# Patient Record
Sex: Male | Born: 1980 | Race: Black or African American | Hispanic: No | Marital: Married | Smoking: Current every day smoker
Health system: Southern US, Community
[De-identification: ages and names within clinical notes are randomized; demographics above are authoritative.]

## PROBLEM LIST (undated history)

## (undated) DIAGNOSIS — M545 Low back pain, unspecified: Secondary | ICD-10-CM

## (undated) DIAGNOSIS — R51 Headache: Secondary | ICD-10-CM

## (undated) DIAGNOSIS — S069X9A Unspecified intracranial injury with loss of consciousness of unspecified duration, initial encounter: Secondary | ICD-10-CM

## (undated) DIAGNOSIS — I456 Pre-excitation syndrome: Secondary | ICD-10-CM

## (undated) DIAGNOSIS — J45909 Unspecified asthma, uncomplicated: Secondary | ICD-10-CM

## (undated) DIAGNOSIS — R569 Unspecified convulsions: Secondary | ICD-10-CM

## (undated) DIAGNOSIS — G8929 Other chronic pain: Secondary | ICD-10-CM

## (undated) DIAGNOSIS — R519 Headache, unspecified: Secondary | ICD-10-CM

## (undated) HISTORY — PX: NO PAST SURGERIES: SHX2092

---
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
## 2015-10-10 ENCOUNTER — Emergency Department (HOSPITAL_COMMUNITY): Payer: Medicaid Other

## 2015-10-10 ENCOUNTER — Inpatient Hospital Stay (HOSPITAL_COMMUNITY): Payer: Medicaid Other

## 2015-10-10 ENCOUNTER — Inpatient Hospital Stay (HOSPITAL_COMMUNITY)
Admission: EM | Admit: 2015-10-10 | Discharge: 2015-10-11 | DRG: 101 | Disposition: A | Discharge status: Home / Self Care | Payer: Medicaid Other | Attending: Cardiovascular Disease | Admitting: Cardiovascular Disease

## 2015-10-10 ENCOUNTER — Encounter (HOSPITAL_COMMUNITY): Payer: Self-pay | Admitting: Emergency Medicine

## 2015-10-10 DIAGNOSIS — F17210 Nicotine dependence, cigarettes, uncomplicated: Secondary | ICD-10-CM | POA: Diagnosis present

## 2015-10-10 DIAGNOSIS — F1290 Cannabis use, unspecified, uncomplicated: Secondary | ICD-10-CM | POA: Diagnosis present

## 2015-10-10 DIAGNOSIS — R9401 Abnormal electroencephalogram [EEG]: Secondary | ICD-10-CM | POA: Diagnosis present

## 2015-10-10 DIAGNOSIS — G40909 Epilepsy, unspecified, not intractable, without status epilepticus: Secondary | ICD-10-CM | POA: Diagnosis not present

## 2015-10-10 DIAGNOSIS — R079 Chest pain, unspecified: Secondary | ICD-10-CM

## 2015-10-10 DIAGNOSIS — R55 Syncope and collapse: Secondary | ICD-10-CM

## 2015-10-10 DIAGNOSIS — I456 Pre-excitation syndrome: Secondary | ICD-10-CM

## 2015-10-10 DIAGNOSIS — R9431 Abnormal electrocardiogram [ECG] [EKG]: Secondary | ICD-10-CM | POA: Diagnosis not present

## 2015-10-10 DIAGNOSIS — Z87820 Personal history of traumatic brain injury: Secondary | ICD-10-CM | POA: Diagnosis not present

## 2015-10-10 DIAGNOSIS — Z8669 Personal history of other diseases of the nervous system and sense organs: Secondary | ICD-10-CM

## 2015-10-10 DIAGNOSIS — R569 Unspecified convulsions: Principal | ICD-10-CM

## 2015-10-10 HISTORY — DX: Unspecified intracranial injury with loss of consciousness of unspecified duration, initial encounter: S06.9X9A

## 2015-10-10 LAB — RAPID URINE DRUG SCREEN, HOSP PERFORMED
AMPHETAMINES: NOT DETECTED
BENZODIAZEPINES: NOT DETECTED
Barbiturates: NOT DETECTED
COCAINE: NOT DETECTED
OPIATES: NOT DETECTED
Tetrahydrocannabinol: POSITIVE — AB

## 2015-10-10 LAB — CBC WITH DIFFERENTIAL/PLATELET
BASOS ABS: 0 10*3/uL (ref 0.0–0.1)
BASOS PCT: 1 %
EOS PCT: 2 %
Eosinophils Absolute: 0.2 10*3/uL (ref 0.0–0.7)
HCT: 37 % — ABNORMAL LOW (ref 39.0–52.0)
Hemoglobin: 12.4 g/dL — ABNORMAL LOW (ref 13.0–17.0)
LYMPHS PCT: 24 %
Lymphs Abs: 2 10*3/uL (ref 0.7–4.0)
MCH: 32 pg (ref 26.0–34.0)
MCHC: 33.5 g/dL (ref 30.0–36.0)
MCV: 95.4 fL (ref 78.0–100.0)
Monocytes Absolute: 0.8 10*3/uL (ref 0.1–1.0)
Monocytes Relative: 10 %
Neutro Abs: 5.3 10*3/uL (ref 1.7–7.7)
Neutrophils Relative %: 63 %
PLATELETS: 235 10*3/uL (ref 150–400)
RBC: 3.88 MIL/uL — AB (ref 4.22–5.81)
RDW: 14.1 % (ref 11.5–15.5)
WBC: 8.4 10*3/uL (ref 4.0–10.5)

## 2015-10-10 LAB — I-STAT CHEM 8, ED
BUN: 11 mg/dL (ref 6–20)
Calcium, Ion: 1.06 mmol/L — ABNORMAL LOW (ref 1.12–1.23)
Chloride: 105 mmol/L (ref 101–111)
Creatinine, Ser: 1.1 mg/dL (ref 0.61–1.24)
GLUCOSE: 91 mg/dL (ref 65–99)
HEMATOCRIT: 40 % (ref 39.0–52.0)
HEMOGLOBIN: 13.6 g/dL (ref 13.0–17.0)
POTASSIUM: 4.2 mmol/L (ref 3.5–5.1)
Sodium: 139 mmol/L (ref 135–145)
TCO2: 25 mmol/L (ref 0–100)

## 2015-10-10 LAB — I-STAT TROPONIN, ED: TROPONIN I, POC: 0 ng/mL (ref 0.00–0.08)

## 2015-10-10 LAB — CBG MONITORING, ED: GLUCOSE-CAPILLARY: 118 mg/dL — AB (ref 65–99)

## 2015-10-10 LAB — COMPREHENSIVE METABOLIC PANEL
ALBUMIN: 3.8 g/dL (ref 3.5–5.0)
ALT: 34 U/L (ref 17–63)
ANION GAP: 5 (ref 5–15)
AST: 29 U/L (ref 15–41)
Alkaline Phosphatase: 36 U/L — ABNORMAL LOW (ref 38–126)
BUN: 10 mg/dL (ref 6–20)
CALCIUM: 8.8 mg/dL — AB (ref 8.9–10.3)
CHLORIDE: 106 mmol/L (ref 101–111)
CO2: 28 mmol/L (ref 22–32)
Creatinine, Ser: 0.92 mg/dL (ref 0.61–1.24)
GFR calc non Af Amer: >60 mL/min (ref >60)
GLUCOSE: 104 mg/dL — AB (ref 65–99)
POTASSIUM: 4.7 mmol/L (ref 3.5–5.1)
SODIUM: 139 mmol/L (ref 135–145)
Total Bilirubin: 0.6 mg/dL (ref 0.3–1.2)
Total Protein: 6.2 g/dL — ABNORMAL LOW (ref 6.5–8.1)

## 2015-10-10 LAB — MRSA PCR SCREENING: MRSA by PCR: NEGATIVE

## 2015-10-10 LAB — APTT: APTT: 153 s — AB (ref 24–37)

## 2015-10-10 LAB — PROTIME-INR
INR: 1.19 (ref 0.00–1.49)
Prothrombin Time: 15.3 seconds — ABNORMAL HIGH (ref 11.6–15.2)

## 2015-10-10 MED ORDER — METOPROLOL TARTRATE 1 MG/ML IV SOLN
INTRAVENOUS | Status: AC
  Filled 2015-10-10: qty 5

## 2015-10-10 MED ORDER — ACETAMINOPHEN 325 MG PO TABS
650.0000 mg | ORAL_TABLET | Freq: Four times a day (QID) | ORAL | Status: DC | PRN
  Administered 2015-10-10 – 2015-10-11 (×3): 650 mg via ORAL
  Filled 2015-10-10 (×2): qty 2

## 2015-10-10 MED ORDER — SODIUM CHLORIDE 0.9 % IV BOLUS (SEPSIS)
500.0000 mL | Freq: Once | INTRAVENOUS | Status: AC
  Administered 2015-10-10: 500 mL via INTRAVENOUS

## 2015-10-10 MED ORDER — MORPHINE SULFATE (PF) 4 MG/ML IV SOLN
4.0000 mg | Freq: Once | INTRAVENOUS | Status: AC
  Administered 2015-10-10: 4 mg via INTRAVENOUS
  Filled 2015-10-10: qty 1

## 2015-10-10 MED ORDER — SODIUM CHLORIDE 0.9 % IV SOLN
500.0000 mg | Freq: Two times a day (BID) | INTRAVENOUS | Status: DC
  Administered 2015-10-11: 500 mg via INTRAVENOUS
  Filled 2015-10-10 (×2): qty 5

## 2015-10-10 MED ORDER — SODIUM CHLORIDE 0.9 % IV SOLN: INTRAVENOUS | Status: DC

## 2015-10-10 MED ORDER — ASPIRIN 81 MG PO CHEW
324.0000 mg | CHEWABLE_TABLET | Freq: Once | ORAL | Status: AC
  Administered 2015-10-10: 324 mg via ORAL

## 2015-10-10 MED ORDER — NITROGLYCERIN 0.4 MG SL SUBL
SUBLINGUAL_TABLET | SUBLINGUAL | Status: AC
  Filled 2015-10-10: qty 1

## 2015-10-10 MED ORDER — INFLUENZA VAC SPLIT QUAD 0.5 ML IM SUSY: 0.5000 mL | PREFILLED_SYRINGE | INTRAMUSCULAR | Status: DC

## 2015-10-10 MED ORDER — HEPARIN SODIUM (PORCINE) 5000 UNIT/ML IJ SOLN
4000.0000 [IU] | INTRAMUSCULAR | Status: AC
  Administered 2015-10-10: 4000 [IU] via INTRAVENOUS
  Filled 2015-10-10: qty 0.8

## 2015-10-10 MED ORDER — ASPIRIN 81 MG PO CHEW
CHEWABLE_TABLET | ORAL | Status: AC
  Administered 2015-10-10: 324 mg via ORAL
  Filled 2015-10-10: qty 4

## 2015-10-10 MED ORDER — SODIUM CHLORIDE 0.9 % IV SOLN
1000.0000 mg | INTRAVENOUS | Status: AC
  Administered 2015-10-10: 1000 mg via INTRAVENOUS
  Filled 2015-10-10: qty 10

## 2015-10-10 MED ORDER — MORPHINE SULFATE (PF) 2 MG/ML IV SOLN
INTRAVENOUS | Status: AC
  Filled 2015-10-10: qty 1
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
## 2015-10-10 NOTE — ED Notes (Signed)
[facility] arrived to transport patient.  
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
## 2015-10-10 NOTE — Progress Notes (Signed)
  Echocardiogram 2D Echocardiogram has been performed.  David Herring, David Herring 10/10/2015, 5:55 PM

## 2015-10-10 NOTE — ED Notes (Signed)
Report given to [facility] via telephone. 
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
## 2015-10-10 NOTE — Progress Notes (Signed)
Utilization Review Completed.Dorcas CarrowDowell, Refugio Mcconico T11/23/2016

## 2015-10-10 NOTE — ED Notes (Signed)
CMP hemolyzed, requested phlebotomy to come recollect due to prior difficult collection.

## 2015-10-10 NOTE — ED Notes (Signed)
Attempted to call report, was requested to call back in 10 minutes.
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
## 2015-10-10 NOTE — ED Notes (Signed)
Bed: [id number] Expected date:  Expected time:  Means of arrival:  Comments: EMS 34 yo male seizure, post-ictal, IV CBG 90

## 2015-10-10 NOTE — ED Notes (Signed)
Report to [facility].  

## 2015-10-10 NOTE — Progress Notes (Signed)
EEG Completed; Results Pending  

## 2015-10-10 NOTE — ED Notes (Signed)
2nd IV attempt in progress, as well as blood collection

## 2015-10-10 NOTE — ED Notes (Signed)
MD/PA at bedside. 

## 2015-10-11 DIAGNOSIS — Z87820 Personal history of traumatic brain injury: Secondary | ICD-10-CM

## 2015-10-11 DIAGNOSIS — I456 Pre-excitation syndrome: Secondary | ICD-10-CM

## 2015-10-11 DIAGNOSIS — R569 Unspecified convulsions: Principal | ICD-10-CM

## 2015-10-11 DIAGNOSIS — G40909 Epilepsy, unspecified, not intractable, without status epilepticus: Secondary | ICD-10-CM

## 2015-10-11 MED ORDER — LEVETIRACETAM 500 MG PO TABS: 500.0000 mg | ORAL_TABLET | Freq: Two times a day (BID) | ORAL | Status: DC

## 2015-10-11 MED ORDER — ACETAMINOPHEN-CODEINE #3 300-30 MG PO TABS
1.0000 | ORAL_TABLET | Freq: Four times a day (QID) | ORAL | Status: DC | PRN
  Administered 2015-10-11: 1 via ORAL
  Filled 2015-10-11: qty 1

## 2015-10-11 MED ORDER — ACETAMINOPHEN-CODEINE #2 300-15 MG PO TABS: 1.0000 | ORAL_TABLET | Freq: Four times a day (QID) | ORAL | Status: DC | PRN

## 2015-10-11 MED ORDER — GADOBENATE DIMEGLUMINE 529 MG/ML IV SOLN
15.0000 mL | Freq: Once | INTRAVENOUS | Status: AC
  Administered 2015-10-11: 15 mL via INTRAVENOUS

## 2015-10-11 NOTE — Progress Notes (Signed)
   SUBJECTIVE: The patient is doing well today.  At this time, he denies chest pain, shortness of breath, or any new concerns.  Wants to go home  . Influenza vac split quadrivalent PF  0.5 mL Intramuscular Tomorrow-1000  . levETIRAcetam  500 mg Intravenous Q12H   . sodium chloride 10 mL/hr at 10/11/15 0700    OBJECTIVE: Physical Exam: Filed Vitals:   10/11/15 0447 10/11/15 0500 10/11/15 0700 10/11/15 0730  BP: 115/56 93/55 101/61   Pulse: 66 65 63 79  Temp: 98 F (36.7 C)   98.2 F (36.8 C)  TempSrc: Oral   Oral  Resp: 21 20 23    Height:      Weight:      SpO2: 97% 97% 100%     Intake/Output Summary (Last 24 hours) at 10/11/15 0948 Last data filed at 10/11/15 0900  Gross per 24 hour  Intake   1275 ml  Output   1425 ml  Net   -150 ml    Telemetry reveals sinus rhythm  GEN- The patient is well appearing, alert and oriented x 3 today.   Head- normocephalic, atraumatic Eyes-  Sclera clear, conjunctiva pink Ears- hearing intact Oropharynx- clear Neck- supple,   Lungs- Clear to ausculation bilaterally, normal work of breathing Heart- Regular rate and rhythm, no murmurs, rubs or gallops, PMI not laterally displaced GI- soft, NT, ND, + BS Extremities- no clubbing, cyanosis, or edema   LABS: Basic Metabolic Panel:  Recent Labs  16/08/9610/23/16 0715 10/10/15 0845  NA 139 139  K 4.2 4.7  CL 105 106  CO2  --  28  GLUCOSE 91 104*  BUN 11 10  CREATININE 1.10 0.92  CALCIUM  --  8.8*   Liver Function Tests:  Recent Labs  10/10/15 0845  AST 29  ALT 34  ALKPHOS 36*  BILITOT 0.6  PROT 6.2*  ALBUMIN 3.8   No results for input(s): LIPASE, AMYLASE in the last 72 hours. CBC:  Recent Labs  10/10/15 0710 10/10/15 0715  WBC 8.4  --   NEUTROABS 5.3  --   HGB 12.4* 13.6  HCT 37.0* 40.0  MCV 95.4  --   PLT 235  --    Echo is pending  ASSESSMENT AND PLAN:  Active Problems:   Abnormal EKG   Seizure (HCC)  1. Pre-excitation Will follow-up with me as an  outpatient I will plan to consider EPS vs ETT when I see him in the office. No further inpatient EP workup planned Awaiting echo  2. Seizures Appreciate neurology assessment Plans for keppra and outpatient neurology follow-up noted  DC to home today No driving x 6 months  Follow-up with me in 4-6 weeks  Hillis RangeJames Aryonna Gunnerson, MD 10/11/2015 9:48 AM

## 2015-10-11 NOTE — Progress Notes (Signed)
Patient discharged to home via w/c.  Discharge instructions given with all questions addressed and answered.  Prescription also given to patient by Thayer Ohmhris B.,N.P..  Telemetry and PIVs x2 discontinued.  Patient awake, alert and oriented at time of discharge.
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
## 2015-10-11 NOTE — Discharge Instructions (Signed)
**  PLEASE REMEMBER TO BRING ALL OF YOUR MEDICATIONS TO EACH OF YOUR FOLLOW-UP OFFICE VISITS.  **IN SETTING OF SEIZURE ACTIVITY, NO DRIVING X 6 MONTHS.

## 2015-10-11 NOTE — Progress Notes (Signed)
Subjective: No recurrence of seizure activity reported. Patient had no new complaints.  Objective: Current vital signs: BP 101/61 mmHg  Pulse 79  Temp(Src) 98.2 F (36.8 C) (Oral)  Resp 23  Ht 5\' 7"  (1.702 m)  Wt 75.4 kg (166 lb 3.6 oz)  BMI 26.03 kg/m2  SpO2 100%  Neurologic Exam: Alert and in no acute distress. Mental status was normal. Extraocular movements were full and conjugate. Face was symmetrical with no weakness. Speech was normal.  MRI of the brain was unremarkable.  EEG on 10/10/2015 was abnormal with fairly frequent frontal and temporal sharp wave discharges, indicating likely increased risk for current seizures.  Medications: I have reviewed the patient's current medications.  Assessment/Plan: 34 year old man with a history of traumatic brain injury and seizures during childhood with recurrence of generalized seizures and abnormal EEG findings indicating increased risk for recurrent seizure activity. MRI of the brain was unremarkable with no signs of an acute lesion.  Recommend continuing Keppra at 500 mg twice a day, and outpatient neurology follow-up following discharge.  I will plan to see this patient on an as-needed basis following this visit. Please feel free to call for follow up evaluation if there are any questions.  C.R. Roseanne RenoStewart, MD [facility] [phone]  10/11/2015  8:38 AM

## 2015-11-12 ENCOUNTER — Observation Stay (HOSPITAL_COMMUNITY)
Admission: EM | Admit: 2015-11-12 | Discharge: 2015-11-13 | Disposition: A | Discharge status: Home / Self Care | Payer: Medicaid Other | Attending: Internal Medicine | Admitting: Internal Medicine

## 2015-11-12 ENCOUNTER — Emergency Department (HOSPITAL_COMMUNITY): Payer: Medicaid Other

## 2015-11-12 ENCOUNTER — Encounter (HOSPITAL_COMMUNITY): Payer: Self-pay

## 2015-11-12 ENCOUNTER — Observation Stay (HOSPITAL_COMMUNITY): Payer: Medicaid Other

## 2015-11-12 DIAGNOSIS — D649 Anemia, unspecified: Secondary | ICD-10-CM | POA: Diagnosis present

## 2015-11-12 DIAGNOSIS — I456 Pre-excitation syndrome: Secondary | ICD-10-CM | POA: Diagnosis present

## 2015-11-12 DIAGNOSIS — R079 Chest pain, unspecified: Principal | ICD-10-CM | POA: Insufficient documentation

## 2015-11-12 DIAGNOSIS — F17210 Nicotine dependence, cigarettes, uncomplicated: Secondary | ICD-10-CM | POA: Diagnosis not present

## 2015-11-12 DIAGNOSIS — Z23 Encounter for immunization: Secondary | ICD-10-CM | POA: Diagnosis not present

## 2015-11-12 DIAGNOSIS — Z79899 Other long term (current) drug therapy: Secondary | ICD-10-CM | POA: Diagnosis not present

## 2015-11-12 DIAGNOSIS — Z87820 Personal history of traumatic brain injury: Secondary | ICD-10-CM | POA: Insufficient documentation

## 2015-11-12 DIAGNOSIS — R569 Unspecified convulsions: Secondary | ICD-10-CM

## 2015-11-12 HISTORY — DX: Low back pain, unspecified: M54.50

## 2015-11-12 HISTORY — DX: Headache, unspecified: R51.9

## 2015-11-12 HISTORY — DX: Unspecified asthma, uncomplicated: J45.909

## 2015-11-12 HISTORY — DX: Headache: R51

## 2015-11-12 HISTORY — DX: Pre-excitation syndrome: I45.6

## 2015-11-12 HISTORY — DX: Low back pain: M54.5

## 2015-11-12 HISTORY — DX: Other chronic pain: G89.29

## 2015-11-12 HISTORY — DX: Unspecified convulsions: R56.9

## 2015-11-12 LAB — BASIC METABOLIC PANEL
Anion gap: 10 (ref 5–15)
BUN: 11 mg/dL (ref 6–20)
CALCIUM: 9 mg/dL (ref 8.9–10.3)
CHLORIDE: 108 mmol/L (ref 101–111)
CO2: 26 mmol/L (ref 22–32)
CREATININE: 1.11 mg/dL (ref 0.61–1.24)
GFR calc non Af Amer: >60 mL/min (ref >60)
GLUCOSE: 103 mg/dL — AB (ref 65–99)
Potassium: 4 mmol/L (ref 3.5–5.1)
Sodium: 144 mmol/L (ref 135–145)

## 2015-11-12 LAB — CBC
HCT: 36.5 % — ABNORMAL LOW (ref 39.0–52.0)
HCT: 37.2 % — ABNORMAL LOW (ref 39.0–52.0)
HEMOGLOBIN: 12.2 g/dL — AB (ref 13.0–17.0)
Hemoglobin: 11.8 g/dL — ABNORMAL LOW (ref 13.0–17.0)
MCH: 31.2 pg (ref 26.0–34.0)
MCH: 31.8 pg (ref 26.0–34.0)
MCHC: 32.3 g/dL (ref 30.0–36.0)
MCHC: 32.8 g/dL (ref 30.0–36.0)
MCV: 96.6 fL (ref 78.0–100.0)
MCV: 96.9 fL (ref 78.0–100.0)
PLATELETS: 237 10*3/uL (ref 150–400)
Platelets: 250 10*3/uL (ref 150–400)
RBC: 3.78 MIL/uL — ABNORMAL LOW (ref 4.22–5.81)
RBC: 3.84 MIL/uL — AB (ref 4.22–5.81)
RDW: 14 % (ref 11.5–15.5)
RDW: 14.2 % (ref 11.5–15.5)
WBC: 7.5 10*3/uL (ref 4.0–10.5)
WBC: 6.9 10*3/uL (ref 4.0–10.5)

## 2015-11-12 LAB — CREATININE, SERUM
CREATININE: 1 mg/dL (ref 0.61–1.24)
GFR calc Af Amer: >60 mL/min (ref >60)

## 2015-11-12 LAB — VITAMIN B12: VITAMIN B 12: 430 pg/mL (ref 180–914)

## 2015-11-12 LAB — RETICULOCYTES
RBC.: 3.76 MIL/uL — AB (ref 4.22–5.81)
RETIC COUNT ABSOLUTE: 45.1 10*3/uL (ref 19.0–186.0)
Retic Ct Pct: 1.2 % (ref 0.4–3.1)

## 2015-11-12 LAB — IRON AND TIBC
Iron: 84 ug/dL (ref 45–182)
Saturation Ratios: 47 % — ABNORMAL HIGH (ref 17.9–39.5)
TIBC: 179 ug/dL — ABNORMAL LOW (ref 250–450)
UIBC: 95 ug/dL

## 2015-11-12 LAB — TROPONIN I
TROPONIN I: 0.03 ng/mL (ref <0.031)
Troponin I: <0.03 ng/mL (ref <0.031)

## 2015-11-12 LAB — RAPID URINE DRUG SCREEN, HOSP PERFORMED
Amphetamines: NOT DETECTED
BARBITURATES: NOT DETECTED
Benzodiazepines: NOT DETECTED
Cocaine: NOT DETECTED
OPIATES: POSITIVE — AB
TETRAHYDROCANNABINOL: POSITIVE — AB

## 2015-11-12 LAB — FOLATE: FOLATE: 15.3 ng/mL (ref >5.9)

## 2015-11-12 LAB — FERRITIN: Ferritin: 7 ng/mL — ABNORMAL LOW (ref 24–336)

## 2015-11-12 MED ORDER — DICLOFENAC SODIUM 1 % TD GEL
4.0000 g | Freq: Four times a day (QID) | TRANSDERMAL | Status: DC
  Administered 2015-11-12 (×3): 4 g via TOPICAL
  Filled 2015-11-12: qty 100

## 2015-11-12 MED ORDER — ACETAMINOPHEN 325 MG PO TABS
650.0000 mg | ORAL_TABLET | ORAL | Status: DC | PRN
  Administered 2015-11-12: 650 mg via ORAL
  Filled 2015-11-12: qty 2

## 2015-11-12 MED ORDER — INFLUENZA VAC SPLIT QUAD 0.5 ML IM SUSY
0.5000 mL | PREFILLED_SYRINGE | INTRAMUSCULAR | Status: AC
  Administered 2015-11-13: 0.5 mL via INTRAMUSCULAR

## 2015-11-12 MED ORDER — MORPHINE SULFATE (PF) 2 MG/ML IV SOLN
2.0000 mg | INTRAVENOUS | Status: DC | PRN
  Administered 2015-11-12: 2 mg via INTRAVENOUS
  Filled 2015-11-12: qty 1

## 2015-11-12 MED ORDER — MORPHINE SULFATE (PF) 4 MG/ML IV SOLN
4.0000 mg | Freq: Once | INTRAVENOUS | Status: AC
  Administered 2015-11-12: 4 mg via INTRAVENOUS
  Filled 2015-11-12: qty 1

## 2015-11-12 MED ORDER — KETOROLAC TROMETHAMINE 30 MG/ML IJ SOLN: 30.0000 mg | Freq: Three times a day (TID) | INTRAMUSCULAR | Status: DC

## 2015-11-12 MED ORDER — ASPIRIN EC 325 MG PO TBEC
325.0000 mg | DELAYED_RELEASE_TABLET | Freq: Every day | ORAL | Status: DC
  Administered 2015-11-12 – 2015-11-13 (×2): 325 mg via ORAL
  Filled 2015-11-12 (×2): qty 1

## 2015-11-12 MED ORDER — LEVETIRACETAM 500 MG PO TABS
500.0000 mg | ORAL_TABLET | Freq: Two times a day (BID) | ORAL | Status: DC
  Administered 2015-11-12 – 2015-11-13 (×2): 500 mg via ORAL
  Filled 2015-11-12 (×2): qty 1

## 2015-11-12 MED ORDER — ENOXAPARIN SODIUM 40 MG/0.4ML ~~LOC~~ SOLN
40.0000 mg | Freq: Every day | SUBCUTANEOUS | Status: DC
  Administered 2015-11-12: 40 mg via SUBCUTANEOUS
  Filled 2015-11-12: qty 0.4

## 2015-11-12 MED ORDER — IOHEXOL 350 MG/ML SOLN
100.0000 mL | Freq: Once | INTRAVENOUS | Status: AC | PRN
  Administered 2015-11-12: 100 mL via INTRAVENOUS

## 2015-11-12 MED ORDER — SODIUM CHLORIDE 0.9 % IV SOLN
1000.0000 mg | Freq: Once | INTRAVENOUS | Status: AC
  Administered 2015-11-12: 1000 mg via INTRAVENOUS
  Filled 2015-11-12: qty 10

## 2015-11-12 MED ORDER — ONDANSETRON HCL 4 MG/2ML IJ SOLN: 4.0000 mg | Freq: Four times a day (QID) | INTRAMUSCULAR | Status: DC | PRN

## 2015-11-12 MED ORDER — PNEUMOCOCCAL VAC POLYVALENT 25 MCG/0.5ML IJ INJ
0.5000 mL | INJECTION | INTRAMUSCULAR | Status: AC
  Administered 2015-11-13: 0.5 mL via INTRAMUSCULAR
  Filled 2015-11-12: qty 0.5

## 2015-11-12 NOTE — ED Notes (Signed)
Dr Oni in room 

## 2015-11-12 NOTE — Plan of Care (Signed)
Problem: Pain Managment: Goal: General experience of comfort will improve Outcome: Progressing Patient receiving voltaren gel to left chest
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
## 2015-11-12 NOTE — ED Notes (Signed)
Per EMS, pt from home with worsening left sided chest pain with radiation to the left shoulder and left hand numbness over the last 2 days. Pt was diagnosed here with Sheppard PentonWolf parkinson white x 1 month ago. Pt states that he has had some sob with some n/v/d a couple times per week since. Pt took a bayer aspirin at 2100 and was given 1 nitro in route that brought his pain from 8/10 down to a 7/10. 12 lead shows wpw and t-wave inversion. BP 110/76, HR 66, CBG normal, 100% on 2L.
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
## 2015-11-12 NOTE — Progress Notes (Signed)
[facility]  34 year old with both Parkinson White syndrome recently admitted for seizure disorder and placed on Keppra. The patient was not able to afford the Keppra and therefore was not taking it and continued to have intermittent tremors. He just got his Medicaid card yesterday. The reason he came to the ER was for left-sided stabbing chest pain which has been on and off for about a month now. He states that it occurs in a variety of situations including exertion and sometimes getting stressed or angry. He is not noted any alleviating factors. He continues to state that the pain is coming from his heart. His wife also states that a couple of weeks ago the patient went upstairs to get something and was later found by his child laying on the floor, semi conscious.  The patient is admitted for further workup of his chest pain. CT scan of the chest is pending. Upon and thus far are negative. My exam reveals tenderness in the left chest wall below his nipple. Voltaren gel has been ordered for this.  In regards to his syncopal episode, have consulted cardiology as he may have had an arrhythmia and may need further workup.  In regards to his seizures, he has been loaded with Keppra and started on oral Keppra. He now has a Medicaid card and will be able to afford his Keppra.   Calvert CantorSaima Shellyann Wandrey, MD

## 2015-11-13 DIAGNOSIS — R072 Precordial pain: Secondary | ICD-10-CM | POA: Diagnosis not present

## 2015-11-13 DIAGNOSIS — I456 Pre-excitation syndrome: Secondary | ICD-10-CM | POA: Diagnosis not present

## 2015-11-13 DIAGNOSIS — D649 Anemia, unspecified: Secondary | ICD-10-CM | POA: Diagnosis not present

## 2015-11-13 DIAGNOSIS — R569 Unspecified convulsions: Secondary | ICD-10-CM | POA: Diagnosis not present

## 2015-11-13 LAB — LIPID PANEL
CHOLESTEROL: 218 mg/dL — AB (ref 0–200)
HDL: 40 mg/dL — ABNORMAL LOW (ref >40)
LDL Cholesterol: 163 mg/dL — ABNORMAL HIGH (ref 0–99)
Total CHOL/HDL Ratio: 5.5 RATIO
Triglycerides: 74 mg/dL (ref <150)
VLDL: 15 mg/dL (ref 0–40)

## 2015-11-13 LAB — TSH: TSH: 0.711 u[IU]/mL (ref 0.350–4.500)

## 2015-11-13 MED ORDER — IBUPROFEN 200 MG PO TABS: 400.0000 mg | ORAL_TABLET | Freq: Four times a day (QID) | ORAL | Status: AC | PRN

## 2015-11-13 MED ORDER — LEVETIRACETAM 500 MG PO TABS: 500.0000 mg | ORAL_TABLET | Freq: Two times a day (BID) | ORAL | Status: DC

## 2015-11-13 NOTE — Progress Notes (Signed)
Patient discharged home with bus pass. Discharge instructions reviewed with patient with no further questions. Prescription given to patient.

## 2015-11-13 NOTE — Discharge Summary (Signed)
Physician Discharge Summary  David GrillsJermane Herring RUE:[id number] DOB: 1981-03-04 DOA: 11/12/2015  PCP: No PCP Per Patient  Admit date: 11/12/2015 Discharge date: 11/13/2015  Time spent: 45 minutes  Recommendations for Outpatient Follow-up:  1. 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 The patient was not able to afford the Keppra and therefore was not taking it and continued to have intermittent tremors. He just got his Medicaid card this week.   The reason he came to the ER was for left-sided stabbing chest pain which has been on and off for about a month now. He states that it occurs in a variety of situations including exertion and sometimes getting stressed or angry. He is not noted any alleviating factors. He continues to state that the pain is coming from his heart. His wife also states that a couple of weeks ago the patient went upstairs to get something and was later found by his child laying on the floor, semi conscious. Patient is not certain of whether he had a seizure or passed out.   Hospital Course:  Chest pain:  Patient was quite anxious that the pain was coming from his heart.  My exam reveals tenderness in the left chest wall below his nipple. Voltaren gel has been ordered for this. CT chest negative for PE. ECHO normal. No further work up needed. The patient was hesitant to believe that his pain is chest wall pain and therefore I consulted Cardiology. Dr Jacinto HalimGanji re-iterated to patient that he does not feel the patient has cardiac related pain. Cont NSAID for chest wall pain.   Recent near syncope: Has an appt with EP for further work up- no arrhythmia's on telemetry  Seizures: He has been loaded with Keppra and  started on oral Keppra. He now has a Medicaid card and will be able to afford Keppra.  Procedures:  ECHO  Left ventricle: The cavity size was normal. Systolic function was normal. The estimated ejection fraction was in the range of 55% to 60%. Wall motion was normal; there were no regional wall motion abnormalities. - Aortic valve: Trileaflet; mildly thickened, mildly calcified leaflets. - Mitral valve: There was trivial regurgitation. - Tricuspid valve: There was mild regurgitation. - Pulmonic valve: There was trivial regurgitation.  Consultations:  cardiology  Discharge Exam: Filed Weights   11/12/15 0617 11/13/15 0500  Weight: 74.889 kg (165 lb 1.6 oz) 75.388 kg (166 lb 3.2 oz)   Filed Vitals:   11/13/15 0500 11/13/15 0945  BP: 119/85 115/75  Pulse: 62 111  Temp: 97.4 F (36.3 C) 97.9 F (36.6 C)  Resp: 18 20    General: AAO x 3, no distress Cardiovascular: RRR, no murmurs  Respiratory: clear to auscultation bilaterally GI: soft, non-tender, non-distended, bowel sound positive  Discharge Instructions You were cared for by a hospitalist during your hospital stay. If you have any questions about your discharge medications or the care you received while you were in the hospital after you are discharged, you can call the unit and asked to speak with the hospitalist on call if the hospitalist that took care of you is not available. Once you are discharged, your primary care physician will handle any further medical issues. Please note that NO  REFILLS for any discharge medications will be authorized once you are discharged, as it is imperative that you return to your primary care physician (or establish a relationship with a primary care physician if you do not have one) for your aftercare needs so that they can reassess your need for medications and monitor your lab values.  Discharge Instructions    Diet - low sodium heart healthy    Complete by:  As directed       Increase activity slowly    Complete by:  As directed             Medication List    TAKE these medications        aspirin 325 MG tablet  Take 325 mg by mouth every 6 (six) hours as needed for mild pain.     ibuprofen 200 MG tablet  Commonly known as:  ADVIL,MOTRIN  Take 2 tablets (400 mg total) by mouth every 6 (six) hours as needed for moderate pain.     levETIRAcetam 500 MG tablet  Commonly known as:  KEPPRA  Take 1 tablet (500 mg total) by mouth 2 (two) times daily.       No Known Allergies    The results of significant diagnostics from this hospitalization (including imaging, microbiology, ancillary and laboratory) are listed below for reference.    Significant Diagnostic Studies: Dg Chest 2 View  11/12/2015  CLINICAL DATA:  Acute onset of left-sided chest pain, radiating to the left shoulder and left hand. Initial encounter. EXAM: CHEST  2 VIEW COMPARISON:  Chest radiograph performed 10/10/2015 FINDINGS: The lungs are well-aerated. Mild left basilar atelectasis is noted. There is no evidence of pleural effusion or pneumothorax. The heart is normal in size; the mediastinal contour is within normal limits. No acute osseous abnormalities are seen. IMPRESSION: Mild left basilar atelectasis noted.  Lungs otherwise grossly clear. Electronically Signed   By: Roanna Raider M.D.   On: 11/12/2015 02:44   Ct Angio Chest Pe W/cm &/or Wo Cm  11/12/2015  CLINICAL DATA:  Pt pt with worsening left sided chest pain with radiation to the left shoulder and left hand numbness over the last 2 days. Pt states pain has been ongoing x 1 month. Pt states that he has had some sob with some n/v/d EXAM: CT ANGIOGRAPHY CHEST WITH CONTRAST TECHNIQUE: Multidetector CT imaging of the chest was performed using the standard protocol during bolus administration of intravenous contrast. Multiplanar CT image reconstructions and MIPs were obtained to evaluate the vascular anatomy. CONTRAST:  OMNIPAQUE  IOHEXOL 350 MG/ML SOLN COMPARISON:  None. FINDINGS: Right arm IV contrast injection. SVC patent. Contrast refluxes from the right atrium into the intrahepatic IVC. Satisfactory opacification of pulmonary arteries noted, and there is no evidence of pulmonary emboli. Patent pulmonary veins. Adequate contrast opacification of the thoracic aorta with no evidence of dissection, aneurysm, or stenosis. There is classic 3-vessel brachiocephalic arch anatomy without proximal stenosis. No pleural or pericardial effusion. No pneumothorax. Subcentimeter prevascular lymph nodes. No hilar adenopathy. Dependent atelectasis or subpleural scarring posteriorly in both lower lobes. Thoracic spine and sternum intact. Visualized portions of upper abdomen unremarkable. Review of the MIP images confirms the above findings. IMPRESSION: 1. Negative.  No acute PE or thoracic aortic dissection. Electronically Signed   By: Corlis Leak M.D.   On: 11/12/2015 11:40    Microbiology: No results found for this or any previous visit (from the past 240 hour(s)).   Labs: Basic Metabolic Panel:  Recent Labs Lab 11/12/15 0213 11/12/15 0947  NA 144  --   K 4.0  --   CL 108  --   CO2 26  --   GLUCOSE 103*  --   BUN 11  --   CREATININE 1.11 1.00  CALCIUM 9.0  --    Liver Function Tests: No results for input(s): AST, ALT, ALKPHOS, BILITOT, PROT, ALBUMIN in the last 168 hours. No results for input(s): LIPASE, AMYLASE in the last 168 hours. No results for input(s): AMMONIA in the last 168 hours. CBC:  Recent Labs Lab 11/12/15 0213 11/12/15 0947  WBC 7.5 6.9  HGB 11.8* 12.2*  HCT 36.5* 37.2*  MCV 96.6 96.9  PLT 237 250   Cardiac Enzymes:  Recent Labs Lab 11/12/15 0213 11/12/15 0947 11/12/15 1155 11/12/15 1848  TROPONINI 0.03 <0.03 <0.03 <0.03   BNP: BNP (last 3 results) No results for input(s): BNP in the last 8760 hours.  ProBNP (last 3 results) No results for input(s): PROBNP in the last 8760  hours.  CBG: No results for input(s): GLUCAP in the last 168 hours.     SignedCalvert Cantor, MD [facility] 11/13/2015, 10:09 AM

## 2015-11-14 ENCOUNTER — Telehealth: Payer: Self-pay | Admitting: Internal Medicine
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
## 2015-11-14 NOTE — Telephone Encounter (Signed)
°  New Prob   Pt has some questions regarding his last hospital stay and plans for a future procedure. Please call.

## 2015-11-14 NOTE — Telephone Encounter (Signed)
I attempted to call the patient back. The person who answered the phone stated he was not there. I advised since he has an appointment with our office tomorrow, we can answer his questions then.

## 2015-11-15 ENCOUNTER — Encounter: Payer: Self-pay | Admitting: Physician Assistant

## 2015-11-27 ENCOUNTER — Encounter: Payer: Self-pay | Admitting: Physician Assistant

## 2015-11-27 ENCOUNTER — Ambulatory Visit (INDEPENDENT_AMBULATORY_CARE_PROVIDER_SITE_OTHER): Payer: Medicaid Other | Admitting: Physician Assistant

## 2015-11-27 VITALS — BP 118/64 | HR 66 | Ht 67.0 in | Wt 170.0 lb

## 2015-11-27 DIAGNOSIS — I456 Pre-excitation syndrome: Secondary | ICD-10-CM

## 2015-11-27 NOTE — Patient Instructions (Signed)
Medication Instructions:   Your physician recommends that you continue on your current medications as directed. Please refer to the Current Medication list given to you today.   If you need a refill on your cardiac medications before your next appointment, please call your pharmacy.  Labwork: NONE ORDER TODAY    Testing/Procedures: NONE ORDER TODAY    Follow-Up: ONE MONTH WITH DR ALLRED    IF NOT AVAILABLE CAN BE WITH SEILER OR URSUY ON THE SAME DAY HE IS IN OFFICE    Any Other Special Instructions Will Be Listed Below (If Applicable).  WILL CONTACT YOUR FURTHER ON ANY UPCOMING TESTS
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
## 2016-01-04 ENCOUNTER — Ambulatory Visit: Payer: Medicaid Other | Admitting: Internal Medicine

## 2016-01-07 ENCOUNTER — Encounter: Payer: Self-pay | Admitting: Internal Medicine

## 2016-03-19 ENCOUNTER — Ambulatory Visit: Payer: Medicaid Other | Admitting: Internal Medicine

## 2016-04-15 ENCOUNTER — Emergency Department (HOSPITAL_COMMUNITY)
Admission: EM | Admit: 2016-04-15 | Discharge: 2016-04-15 | Disposition: A | Discharge status: Home / Self Care | Payer: Medicaid Other | Attending: Emergency Medicine | Admitting: Emergency Medicine

## 2016-04-15 ENCOUNTER — Emergency Department (HOSPITAL_COMMUNITY): Payer: Medicaid Other

## 2016-04-15 ENCOUNTER — Encounter (HOSPITAL_COMMUNITY): Payer: Self-pay | Admitting: Emergency Medicine

## 2016-04-15 DIAGNOSIS — F17210 Nicotine dependence, cigarettes, uncomplicated: Secondary | ICD-10-CM | POA: Diagnosis not present

## 2016-04-15 DIAGNOSIS — Z79899 Other long term (current) drug therapy: Secondary | ICD-10-CM | POA: Diagnosis not present

## 2016-04-15 DIAGNOSIS — R079 Chest pain, unspecified: Secondary | ICD-10-CM | POA: Diagnosis present

## 2016-04-15 DIAGNOSIS — R0789 Other chest pain: Secondary | ICD-10-CM | POA: Diagnosis not present

## 2016-04-15 DIAGNOSIS — G40909 Epilepsy, unspecified, not intractable, without status epilepticus: Secondary | ICD-10-CM | POA: Insufficient documentation

## 2016-04-15 DIAGNOSIS — J45909 Unspecified asthma, uncomplicated: Secondary | ICD-10-CM | POA: Insufficient documentation

## 2016-04-15 DIAGNOSIS — G8929 Other chronic pain: Secondary | ICD-10-CM | POA: Insufficient documentation

## 2016-04-15 DIAGNOSIS — Z87820 Personal history of traumatic brain injury: Secondary | ICD-10-CM | POA: Insufficient documentation

## 2016-04-15 DIAGNOSIS — Z8679 Personal history of other diseases of the circulatory system: Secondary | ICD-10-CM | POA: Insufficient documentation

## 2016-04-15 DIAGNOSIS — Z7951 Long term (current) use of inhaled steroids: Secondary | ICD-10-CM | POA: Diagnosis not present

## 2016-04-15 LAB — BASIC METABOLIC PANEL
ANION GAP: 6 (ref 5–15)
BUN: 8 mg/dL (ref 6–20)
CHLORIDE: 109 mmol/L (ref 101–111)
CO2: 26 mmol/L (ref 22–32)
CREATININE: 0.89 mg/dL (ref 0.61–1.24)
Calcium: 9.2 mg/dL (ref 8.9–10.3)
GFR calc non Af Amer: >60 mL/min (ref >60)
Glucose, Bld: 97 mg/dL (ref 65–99)
POTASSIUM: 4.2 mmol/L (ref 3.5–5.1)
SODIUM: 141 mmol/L (ref 135–145)

## 2016-04-15 LAB — CBC
HCT: 40.1 % (ref 39.0–52.0)
HEMOGLOBIN: 13.1 g/dL (ref 13.0–17.0)
MCH: 31.3 pg (ref 26.0–34.0)
MCHC: 32.7 g/dL (ref 30.0–36.0)
MCV: 95.9 fL (ref 78.0–100.0)
PLATELETS: 235 10*3/uL (ref 150–400)
RBC: 4.18 MIL/uL — AB (ref 4.22–5.81)
RDW: 15.1 % (ref 11.5–15.5)
WBC: 7.8 10*3/uL (ref 4.0–10.5)

## 2016-04-15 LAB — I-STAT TROPONIN, ED: Troponin i, poc: 0 ng/mL (ref 0.00–0.08)

## 2016-04-15 MED ORDER — SODIUM CHLORIDE 0.9 % IV SOLN
1500.0000 mg | Freq: Once | INTRAVENOUS | Status: AC
  Administered 2016-04-15: 1500 mg via INTRAVENOUS
  Filled 2016-04-15: qty 15

## 2016-04-15 MED ORDER — FLUTICASONE PROPIONATE 50 MCG/ACT NA SUSP: 2.0000 | Freq: Every day | NASAL | Status: DC

## 2016-04-15 MED ORDER — LEVETIRACETAM 500 MG PO TABS: 500.0000 mg | ORAL_TABLET | Freq: Two times a day (BID) | ORAL | Status: DC

## 2016-04-15 NOTE — ED Notes (Signed)
Pt sts had seizure last night and now having CP; pt sts some pain in nose

## 2016-04-15 NOTE — ED Notes (Signed)
MD at bedside. 

## 2016-04-15 NOTE — Discharge Instructions (Signed)
Take your medicine.  Call the number listed to find a provider.  If you want help quitting smoking there is another hotline number available for you. Try flonase for your nasal symptoms.  You can buy this over the counter.  Chest Wall Pain Chest wall pain is pain in or around the bones and muscles of your chest. Sometimes, an injury causes this pain. Sometimes, the cause may not be known. This pain may take several weeks or longer to get better. HOME CARE INSTRUCTIONS  Pay attention to any changes in your symptoms. Take these actions to help with your pain:   Rest as told by your health care provider.   Avoid activities that cause pain. These include any activities that use your chest muscles or your abdominal and side muscles to lift heavy items.   If directed, apply ice to the painful area:  Put ice in a plastic bag.  Place a towel between your skin and the bag.  Leave the ice on for 20 minutes, 2-3 times per day.  Take over-the-counter and prescription medicines only as told by your health care provider.  Do not use tobacco products, including cigarettes, chewing tobacco, and e-cigarettes. If you need help quitting, ask your health care provider.  Keep all follow-up visits as told by your health care provider. This is important. SEEK MEDICAL CARE IF:  You have a fever.  Your chest pain becomes worse.  You have new symptoms. SEEK IMMEDIATE MEDICAL CARE IF:  You have nausea or vomiting.  You feel sweaty or light-headed.  You have a cough with phlegm (sputum) or you cough up blood.  You develop shortness of breath.   This information is not intended to replace advice given to you by your health care provider. Make sure you discuss any questions you have with your health care provider.   Document Released: 11/03/2005 Document Revised: 07/25/2015 Document Reviewed: 01/29/2015 Elsevier Interactive Patient Education [employer]2016 Elsevier Inc.
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
## 2016-04-15 NOTE — ED Notes (Signed)
Pt ambulates independently and with steady gait at time of discharge. Discharge instructions and follow up information reviewed with patient. No other questions or concerns voiced at this time.  

## 2016-04-16 LAB — LEVETIRACETAM LEVEL: Levetiracetam Lvl: NOT DETECTED ug/mL (ref 10.0–40.0)

## 2016-05-14 ENCOUNTER — Ambulatory Visit (INDEPENDENT_AMBULATORY_CARE_PROVIDER_SITE_OTHER): Payer: Medicaid Other | Admitting: Internal Medicine

## 2016-05-14 ENCOUNTER — Encounter: Payer: Self-pay | Admitting: Internal Medicine

## 2016-05-14 VITALS — BP 122/84 | HR 62 | Ht 67.0 in | Wt 151.2 lb

## 2016-05-14 DIAGNOSIS — R55 Syncope and collapse: Secondary | ICD-10-CM | POA: Diagnosis not present

## 2016-05-14 DIAGNOSIS — I456 Pre-excitation syndrome: Secondary | ICD-10-CM | POA: Diagnosis not present

## 2016-05-14 NOTE — Patient Instructions (Signed)
Medication Instructions:  Your physician recommends that you continue on your current medications as directed. Please refer to the Current Medication list given to you today.   Labwork: None ordered   Testing/Procedures: Your physician has requested that you have an exercise tolerance test. For further information please visit [email or web address]. Please also follow instruction sheet, as given. (Schedule for a day that [facility] is here)    Follow-Up: Your physician wants you to follow-up: Pending results of exercise tolerance test. We will call you with results.  Make appointment with your PCP concerning additional issues    Any Other Special Instructions Will Be Listed Below (If Applicable).     If you need a refill on your cardiac medications before your next appointment, please call your pharmacy.
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
## 2016-06-11 ENCOUNTER — Encounter: Payer: Medicaid Other | Admitting: Internal Medicine

## 2016-06-11 ENCOUNTER — Encounter (INDEPENDENT_AMBULATORY_CARE_PROVIDER_SITE_OTHER): Payer: Self-pay

## 2016-06-11 ENCOUNTER — Ambulatory Visit (INDEPENDENT_AMBULATORY_CARE_PROVIDER_SITE_OTHER): Payer: Medicaid Other

## 2016-06-11 DIAGNOSIS — I456 Pre-excitation syndrome: Secondary | ICD-10-CM | POA: Diagnosis not present

## 2016-06-11 DIAGNOSIS — R55 Syncope and collapse: Secondary | ICD-10-CM

## 2016-06-13 LAB — EXERCISE TOLERANCE TEST
CHL CUP MPHR: 183 {beats}/min
CSEPED: 12 min
CSEPHR: 97 %
CSEPPHR: 179 {beats}/min
Estimated workload: 14.6 METS
Exercise duration (sec): 40 s
RPE: 16
Rest HR: 64 {beats}/min

## 2016-06-13 NOTE — Progress Notes (Signed)
Adult ECG Report   Name: David Herring  Age: 35 y.o.  Gender: male    Rate: 65  Rhythm: normal sinus rhythm  Abnormalities:  + pre-excitation   Narrative Interpretation:  Pre-excitation at baseline   Adult Stress Test Report  06/11/2016   Requesting Physician: No PCP Per Patient  Study: exercise ECG  Pre-test ECG: abnormal pre-exication  Level of Stress:  90% age-predicted max HR  14.6 METS achieved  Functional Capacity: normal  Abnormal Symptoms: none  Heart Rate Response: normal  BP Response:  normal       Stress ECG: nondiagnostic changes  Stress Imaging Report:  normal   Study performed to evaluate conduction properties of accessory pathway.  This is not intended to evaluate ischemia and is nondiagnostic for ischemia. The patient has pre-excitation at baseline heart rate of 64 bpm.  With exercise at 104 bpm, the patient began having fusion between accessory pathway and AV node with much less prominent pre-excitation.  This occurred with stage 1 of exercise. With heart rates of 126 bpm and greater (stage 3), the patient had only intermittent conduction through his accessory pathway.  With heart rates of 151 bpm (stage 4), conduction was over AV node only (no pathway conduction).  This finding was consistent at stage 5 (heart rates of up to 179 bpm). During recovery, pathway conduction began to return intermittently at heart rates of 141 bpm.  Pathway conduction was fused with AV nodal conduction at heart rates of 115 bpm.  The patient was conducting fully over his accessory pathway again by 3 minutes into recovery as heart rate was 96 bpm.   Impression:    This study demonstrates that the patients accessory pathway does not conduct at higher heart rates and can be safely observed without ablation.  Upon long discussion with the patient today, he is very clear in his interest in avoiding any EP procedures.  He does not have any symptoms to suggest WPW.  We will  therefore follow conservatively per his request.  Return to see me in 6 months unless problems arise.  Interpreted by:  Hillis Range 06/13/2016  This encounter was created in error - please disregard.  This encounter was created in error - please disregard.

## 2016-10-19 ENCOUNTER — Emergency Department (HOSPITAL_COMMUNITY): Payer: Medicaid Other

## 2016-10-19 ENCOUNTER — Emergency Department (HOSPITAL_COMMUNITY)
Admission: EM | Admit: 2016-10-19 | Discharge: 2016-10-19 | Disposition: A | Discharge status: Home / Self Care | Payer: Medicaid Other | Attending: Emergency Medicine | Admitting: Emergency Medicine

## 2016-10-19 ENCOUNTER — Encounter (HOSPITAL_COMMUNITY): Payer: Self-pay | Admitting: Emergency Medicine

## 2016-10-19 DIAGNOSIS — W500XXA Accidental hit or strike by another person, initial encounter: Secondary | ICD-10-CM | POA: Diagnosis not present

## 2016-10-19 DIAGNOSIS — F17210 Nicotine dependence, cigarettes, uncomplicated: Secondary | ICD-10-CM | POA: Insufficient documentation

## 2016-10-19 DIAGNOSIS — Y9389 Activity, other specified: Secondary | ICD-10-CM | POA: Insufficient documentation

## 2016-10-19 DIAGNOSIS — Y929 Unspecified place or not applicable: Secondary | ICD-10-CM | POA: Diagnosis not present

## 2016-10-19 DIAGNOSIS — S62315A Displaced fracture of base of fourth metacarpal bone, left hand, initial encounter for closed fracture: Secondary | ICD-10-CM | POA: Diagnosis not present

## 2016-10-19 DIAGNOSIS — S6992XA Unspecified injury of left wrist, hand and finger(s), initial encounter: Secondary | ICD-10-CM | POA: Diagnosis present

## 2016-10-19 DIAGNOSIS — J45909 Unspecified asthma, uncomplicated: Secondary | ICD-10-CM | POA: Insufficient documentation

## 2016-10-19 DIAGNOSIS — Y999 Unspecified external cause status: Secondary | ICD-10-CM | POA: Diagnosis not present

## 2016-10-19 MED ORDER — HYDROCODONE-ACETAMINOPHEN 5-325 MG PO TABS: 1.0000 | ORAL_TABLET | Freq: Once | ORAL | Status: DC

## 2016-10-19 MED ORDER — NAPROXEN 250 MG PO TABS
500.0000 mg | ORAL_TABLET | Freq: Once | ORAL | Status: AC
  Administered 2016-10-19: 500 mg via ORAL
  Filled 2016-10-19: qty 2

## 2016-10-19 MED ORDER — NAPROXEN 500 MG PO TABS: 500.0000 mg | ORAL_TABLET | Freq: Two times a day (BID) | ORAL | 0 refills | Status: DC

## 2016-10-19 NOTE — Progress Notes (Signed)
Orthopedic Tech Progress Note Patient Details:  David Herring 1981-11-03 [id number]  Ortho Devices Type of Ortho Device: Ulna gutter splint Ortho Device/Splint Location: lue Ortho Device/Splint Interventions: Ordered, Application   Trinna PostMartinez, Lashawnda Hancox J 10/19/2016, 4:32 AM

## 2016-10-19 NOTE — ED Notes (Signed)
Patient here with other family members and has now decided to be seen for a swollen left hand that has been going on for about 2 weeks. Asked if we could just wrap it for him instead of being seen.

## 2016-10-19 NOTE — ED Notes (Signed)
Patient transported to X-ray 

## 2016-10-19 NOTE — Discharge Instructions (Signed)
Follow-up with orthopedic hand surgery in 1-2 weeks for recheck.
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
## 2016-11-04 ENCOUNTER — Emergency Department (HOSPITAL_COMMUNITY): Payer: Medicaid Other

## 2016-11-04 ENCOUNTER — Emergency Department (HOSPITAL_COMMUNITY)
Admission: EM | Admit: 2016-11-04 | Discharge: 2016-11-04 | Disposition: A | Discharge status: Home / Self Care | Payer: Medicaid Other | Attending: Emergency Medicine | Admitting: Emergency Medicine

## 2016-11-04 ENCOUNTER — Encounter (HOSPITAL_COMMUNITY): Payer: Self-pay | Admitting: Emergency Medicine

## 2016-11-04 DIAGNOSIS — R569 Unspecified convulsions: Secondary | ICD-10-CM

## 2016-11-04 DIAGNOSIS — J45909 Unspecified asthma, uncomplicated: Secondary | ICD-10-CM | POA: Diagnosis not present

## 2016-11-04 DIAGNOSIS — R51 Headache: Secondary | ICD-10-CM | POA: Insufficient documentation

## 2016-11-04 DIAGNOSIS — F17210 Nicotine dependence, cigarettes, uncomplicated: Secondary | ICD-10-CM | POA: Insufficient documentation

## 2016-11-04 DIAGNOSIS — X58XXXD Exposure to other specified factors, subsequent encounter: Secondary | ICD-10-CM | POA: Insufficient documentation

## 2016-11-04 DIAGNOSIS — S62345G Nondisplaced fracture of base of fourth metacarpal bone, left hand, subsequent encounter for fracture with delayed healing: Secondary | ICD-10-CM | POA: Diagnosis present

## 2016-11-04 DIAGNOSIS — Z79899 Other long term (current) drug therapy: Secondary | ICD-10-CM | POA: Diagnosis not present

## 2016-11-04 LAB — RAPID URINE DRUG SCREEN, HOSP PERFORMED
AMPHETAMINES: NOT DETECTED
BARBITURATES: NOT DETECTED
BENZODIAZEPINES: NOT DETECTED
COCAINE: NOT DETECTED
Opiates: NOT DETECTED
TETRAHYDROCANNABINOL: POSITIVE — AB

## 2016-11-04 LAB — CBC WITH DIFFERENTIAL/PLATELET
BASOS ABS: 0 10*3/uL (ref 0.0–0.1)
BASOS PCT: 1 %
EOS ABS: 0.2 10*3/uL (ref 0.0–0.7)
EOS PCT: 3 %
HCT: 41.8 % (ref 39.0–52.0)
Hemoglobin: 14.2 g/dL (ref 13.0–17.0)
Lymphocytes Relative: 39 %
Lymphs Abs: 2.4 10*3/uL (ref 0.7–4.0)
MCH: 32.4 pg (ref 26.0–34.0)
MCHC: 34 g/dL (ref 30.0–36.0)
MCV: 95.4 fL (ref 78.0–100.0)
MONO ABS: 0.6 10*3/uL (ref 0.1–1.0)
Monocytes Relative: 9 %
Neutro Abs: 3.1 10*3/uL (ref 1.7–7.7)
Neutrophils Relative %: 48 %
PLATELETS: 228 10*3/uL (ref 150–400)
RBC: 4.38 MIL/uL (ref 4.22–5.81)
RDW: 14 % (ref 11.5–15.5)
WBC: 6.2 10*3/uL (ref 4.0–10.5)

## 2016-11-04 LAB — COMPREHENSIVE METABOLIC PANEL
ALBUMIN: 4.1 g/dL (ref 3.5–5.0)
ALK PHOS: 39 U/L (ref 38–126)
ALT: 19 U/L (ref 17–63)
ANION GAP: 9 (ref 5–15)
AST: 26 U/L (ref 15–41)
BUN: 8 mg/dL (ref 6–20)
CALCIUM: 9.4 mg/dL (ref 8.9–10.3)
CHLORIDE: 103 mmol/L (ref 101–111)
CO2: 26 mmol/L (ref 22–32)
Creatinine, Ser: 0.91 mg/dL (ref 0.61–1.24)
GFR calc non Af Amer: >60 mL/min (ref >60)
GLUCOSE: 84 mg/dL (ref 65–99)
POTASSIUM: 4 mmol/L (ref 3.5–5.1)
SODIUM: 138 mmol/L (ref 135–145)
Total Bilirubin: 0.6 mg/dL (ref 0.3–1.2)
Total Protein: 7.1 g/dL (ref 6.5–8.1)

## 2016-11-04 MED ORDER — SODIUM CHLORIDE 0.9 % IV BOLUS (SEPSIS)
1000.0000 mL | Freq: Once | INTRAVENOUS | Status: AC
  Administered 2016-11-04: 1000 mL via INTRAVENOUS

## 2016-11-04 MED ORDER — ACETAMINOPHEN 325 MG PO TABS
650.0000 mg | ORAL_TABLET | Freq: Once | ORAL | Status: AC
  Administered 2016-11-04: 650 mg via ORAL
  Filled 2016-11-04: qty 2

## 2016-11-04 MED ORDER — NAPROXEN 500 MG PO TABS: 500.0000 mg | ORAL_TABLET | Freq: Two times a day (BID) | ORAL | 0 refills | Status: AC

## 2016-11-04 NOTE — ED Triage Notes (Signed)
Pt states had 2 seizures last night, hurt left arm, hand is swollen from prior injury== states has a hx of TBI, pt is crying,  Wife states that pt is here hand injury -- had a cast on and took it off himself.

## 2016-11-04 NOTE — Progress Notes (Signed)
Orthopedic Tech Progress Note Patient Details:  Verlin GrillsJermane Furness [birth year] [id number]  Ortho Devices Type of Ortho Device: Velcro wrist splint Ortho Device/Splint Location: lue Ortho Device/Splint Interventions: Application   Ximenna Fonseca 11/04/2016, 3:16 PM As ordered by Dr. Skeet Simmerarmanda

## 2016-11-04 NOTE — ED Notes (Signed)
Left hand/wrist fractured. Cast removed by patient. Patient complaining of pain in left arm and hand.

## 2016-11-04 NOTE — ED Notes (Signed)
Given black coffee to drink.

## 2016-11-04 NOTE — Discharge Instructions (Signed)
We evaluated you today for your seizures and left hand pain. Your head imaging was negative for any new finding or bleed.  It is very important to keep taking the Keppra 500mg  twice a day every day to help prevent further seizures. Please follow up with neurology for continued management of your seizures.   For your hand, we provided you with a splint. Please take naproxen 500mg  twice a day to control the swelling and pain.  I have provided list of clinics where you can establish for primary care.
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
## 2017-06-07 ENCOUNTER — Emergency Department (HOSPITAL_COMMUNITY): Payer: Medicaid Other

## 2017-06-07 ENCOUNTER — Encounter (HOSPITAL_COMMUNITY): Payer: Self-pay

## 2017-06-07 ENCOUNTER — Emergency Department (HOSPITAL_COMMUNITY)
Admission: EM | Admit: 2017-06-07 | Discharge: 2017-06-07 | Disposition: A | Discharge status: Home / Self Care | Payer: Medicaid Other | Attending: Emergency Medicine | Admitting: Emergency Medicine

## 2017-06-07 DIAGNOSIS — F17210 Nicotine dependence, cigarettes, uncomplicated: Secondary | ICD-10-CM | POA: Insufficient documentation

## 2017-06-07 DIAGNOSIS — R569 Unspecified convulsions: Secondary | ICD-10-CM | POA: Diagnosis present

## 2017-06-07 DIAGNOSIS — Z79899 Other long term (current) drug therapy: Secondary | ICD-10-CM | POA: Diagnosis not present

## 2017-06-07 DIAGNOSIS — J189 Pneumonia, unspecified organism: Secondary | ICD-10-CM

## 2017-06-07 DIAGNOSIS — J181 Lobar pneumonia, unspecified organism: Secondary | ICD-10-CM | POA: Diagnosis not present

## 2017-06-07 DIAGNOSIS — J45909 Unspecified asthma, uncomplicated: Secondary | ICD-10-CM | POA: Insufficient documentation

## 2017-06-07 LAB — CBC WITH DIFFERENTIAL/PLATELET
BASOS ABS: 0 10*3/uL (ref 0.0–0.1)
Basophils Relative: 0 %
EOS PCT: 1 %
Eosinophils Absolute: 0.1 10*3/uL (ref 0.0–0.7)
HCT: 43.5 % (ref 39.0–52.0)
HEMOGLOBIN: 14.9 g/dL (ref 13.0–17.0)
LYMPHS ABS: 1.6 10*3/uL (ref 0.7–4.0)
LYMPHS PCT: 16 %
MCH: 33 pg (ref 26.0–34.0)
MCHC: 34.3 g/dL (ref 30.0–36.0)
MCV: 96.5 fL (ref 78.0–100.0)
Monocytes Absolute: 0.9 10*3/uL (ref 0.1–1.0)
Monocytes Relative: 9 %
NEUTROS ABS: 7.3 10*3/uL (ref 1.7–7.7)
NEUTROS PCT: 74 %
PLATELETS: 208 10*3/uL (ref 150–400)
RBC: 4.51 MIL/uL (ref 4.22–5.81)
RDW: 14.1 % (ref 11.5–15.5)
WBC: 9.8 10*3/uL (ref 4.0–10.5)

## 2017-06-07 LAB — COMPREHENSIVE METABOLIC PANEL
ALK PHOS: 40 U/L (ref 38–126)
ALT: 20 U/L (ref 17–63)
AST: 23 U/L (ref 15–41)
Albumin: 4 g/dL (ref 3.5–5.0)
Anion gap: 6 (ref 5–15)
BUN: 12 mg/dL (ref 6–20)
CALCIUM: 9.3 mg/dL (ref 8.9–10.3)
CHLORIDE: 108 mmol/L (ref 101–111)
CO2: 27 mmol/L (ref 22–32)
CREATININE: 1.16 mg/dL (ref 0.61–1.24)
Glucose, Bld: 99 mg/dL (ref 65–99)
Potassium: 4.2 mmol/L (ref 3.5–5.1)
Sodium: 141 mmol/L (ref 135–145)
Total Bilirubin: 0.4 mg/dL (ref 0.3–1.2)
Total Protein: 7.4 g/dL (ref 6.5–8.1)

## 2017-06-07 LAB — CBG MONITORING, ED: Glucose-Capillary: 115 mg/dL — ABNORMAL HIGH (ref 65–99)

## 2017-06-07 MED ORDER — PROMETHAZINE-DM 6.25-15 MG/5ML PO SYRP: 5.0000 mL | ORAL_SOLUTION | Freq: Four times a day (QID) | ORAL | 0 refills | Status: AC | PRN

## 2017-06-07 MED ORDER — AZITHROMYCIN 250 MG PO TABS: ORAL_TABLET | ORAL | 0 refills | Status: DC

## 2017-06-07 MED ORDER — ALBUTEROL SULFATE (2.5 MG/3ML) 0.083% IN NEBU
5.0000 mg | INHALATION_SOLUTION | Freq: Once | RESPIRATORY_TRACT | Status: AC
  Administered 2017-06-07: 5 mg via RESPIRATORY_TRACT
  Filled 2017-06-07: qty 6

## 2017-06-07 MED ORDER — ACETAMINOPHEN 500 MG PO TABS
1000.0000 mg | ORAL_TABLET | Freq: Once | ORAL | Status: AC
  Administered 2017-06-07: 1000 mg via ORAL
  Filled 2017-06-07: qty 2

## 2017-06-07 MED ORDER — AEROCHAMBER PLUS FLO-VU MEDIUM MISC
1.0000 | Freq: Once | Status: AC
  Administered 2017-06-07: 1
  Filled 2017-06-07: qty 1

## 2017-06-07 MED ORDER — IPRATROPIUM BROMIDE 0.02 % IN SOLN
0.5000 mg | Freq: Once | RESPIRATORY_TRACT | Status: AC
  Administered 2017-06-07: 0.5 mg via RESPIRATORY_TRACT
  Filled 2017-06-07: qty 2.5

## 2017-06-07 MED ORDER — DEXTROSE 5 % IV SOLN
1.0000 g | Freq: Once | INTRAVENOUS | Status: AC
  Administered 2017-06-07: 1 g via INTRAVENOUS
  Filled 2017-06-07: qty 10

## 2017-06-07 MED ORDER — GUAIFENESIN ER 1200 MG PO TB12: 1.0000 | ORAL_TABLET | Freq: Two times a day (BID) | ORAL | 0 refills | Status: AC

## 2017-06-07 MED ORDER — ALBUTEROL SULFATE HFA 108 (90 BASE) MCG/ACT IN AERS
2.0000 | INHALATION_SPRAY | RESPIRATORY_TRACT | Status: DC | PRN
  Administered 2017-06-07: 2 via RESPIRATORY_TRACT
  Filled 2017-06-07: qty 6.7

## 2017-06-07 MED ORDER — IBUPROFEN 800 MG PO TABS
800.0000 mg | ORAL_TABLET | Freq: Once | ORAL | Status: AC
  Administered 2017-06-07: 800 mg via ORAL
  Filled 2017-06-07: qty 1

## 2017-06-07 NOTE — ED Notes (Signed)
Bed: WA20 Expected date:  Expected time:  Means of arrival:  Comments: 36 m seizure

## 2017-06-07 NOTE — Discharge Instructions (Signed)
Your x-ray shows a pneumonia.  Return here for any worsening in your condition

## 2017-06-07 NOTE — ED Notes (Signed)
Patient ambulated in the hallway with no difficulties. Patient oxygen sat remain at 98-99% on room air while ambulating.

## 2017-06-07 NOTE — ED Triage Notes (Signed)
States productive cough greenish brown for a couple of days and had seizure this am with vomiting during seizure alert and oriented on arrival to ER.
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
## 2017-12-17 ENCOUNTER — Encounter (HOSPITAL_COMMUNITY): Payer: Self-pay | Admitting: Emergency Medicine

## 2017-12-17 ENCOUNTER — Other Ambulatory Visit: Payer: Self-pay

## 2017-12-17 ENCOUNTER — Emergency Department (HOSPITAL_COMMUNITY)
Admission: EM | Admit: 2017-12-17 | Discharge: 2017-12-17 | Disposition: A | Discharge status: Home / Self Care | Payer: Medicaid Other | Attending: Emergency Medicine | Admitting: Emergency Medicine

## 2017-12-17 ENCOUNTER — Emergency Department (HOSPITAL_COMMUNITY): Payer: Medicaid Other

## 2017-12-17 DIAGNOSIS — R569 Unspecified convulsions: Secondary | ICD-10-CM

## 2017-12-17 DIAGNOSIS — F17210 Nicotine dependence, cigarettes, uncomplicated: Secondary | ICD-10-CM | POA: Diagnosis not present

## 2017-12-17 LAB — CBG MONITORING, ED: GLUCOSE-CAPILLARY: 90 mg/dL (ref 65–99)

## 2017-12-17 LAB — CBC WITH DIFFERENTIAL/PLATELET
BASOS PCT: 1 %
Basophils Absolute: 0.1 10*3/uL (ref 0.0–0.1)
EOS ABS: 0.2 10*3/uL (ref 0.0–0.7)
EOS PCT: 3 %
HCT: 41.9 % (ref 39.0–52.0)
HEMOGLOBIN: 14.1 g/dL (ref 13.0–17.0)
Lymphocytes Relative: 35 %
Lymphs Abs: 1.9 10*3/uL (ref 0.7–4.0)
MCH: 32.6 pg (ref 26.0–34.0)
MCHC: 33.7 g/dL (ref 30.0–36.0)
MCV: 97 fL (ref 78.0–100.0)
MONO ABS: 0.5 10*3/uL (ref 0.1–1.0)
MONOS PCT: 9 %
NEUTROS PCT: 52 %
Neutro Abs: 2.8 10*3/uL (ref 1.7–7.7)
PLATELETS: 206 10*3/uL (ref 150–400)
RBC: 4.32 MIL/uL (ref 4.22–5.81)
RDW: 13.6 % (ref 11.5–15.5)
WBC: 5.4 10*3/uL (ref 4.0–10.5)

## 2017-12-17 LAB — COMPREHENSIVE METABOLIC PANEL
ALBUMIN: 3.6 g/dL (ref 3.5–5.0)
ALT: 14 U/L — ABNORMAL LOW (ref 17–63)
ANION GAP: 10 (ref 5–15)
AST: 22 U/L (ref 15–41)
Alkaline Phosphatase: 37 U/L — ABNORMAL LOW (ref 38–126)
BUN: 8 mg/dL (ref 6–20)
CO2: 21 mmol/L — AB (ref 22–32)
Calcium: 9 mg/dL (ref 8.9–10.3)
Chloride: 108 mmol/L (ref 101–111)
Creatinine, Ser: 0.93 mg/dL (ref 0.61–1.24)
GFR calc Af Amer: >60 mL/min (ref >60)
GFR calc non Af Amer: >60 mL/min (ref >60)
GLUCOSE: 95 mg/dL (ref 65–99)
POTASSIUM: 4 mmol/L (ref 3.5–5.1)
SODIUM: 139 mmol/L (ref 135–145)
Total Bilirubin: 0.7 mg/dL (ref 0.3–1.2)
Total Protein: 6.4 g/dL — ABNORMAL LOW (ref 6.5–8.1)

## 2017-12-17 LAB — MAGNESIUM: MAGNESIUM: 2 mg/dL (ref 1.7–2.4)

## 2017-12-17 MED ORDER — SODIUM CHLORIDE 0.9 % IV SOLN
1000.0000 mg | Freq: Once | INTRAVENOUS | Status: AC
  Administered 2017-12-17: 1000 mg via INTRAVENOUS
  Filled 2017-12-17: qty 10

## 2017-12-17 MED ORDER — LEVETIRACETAM 500 MG PO TABS: 500.0000 mg | ORAL_TABLET | Freq: Two times a day (BID) | ORAL | 1 refills | Status: DC

## 2017-12-17 MED ORDER — ACETAMINOPHEN 325 MG PO TABS
650.0000 mg | ORAL_TABLET | Freq: Once | ORAL | Status: AC
  Administered 2017-12-17: 650 mg via ORAL
  Filled 2017-12-17: qty 2

## 2017-12-17 MED ORDER — SODIUM CHLORIDE 0.9 % IV BOLUS (SEPSIS)
1000.0000 mL | Freq: Once | INTRAVENOUS | Status: AC
  Administered 2017-12-17: 1000 mL via INTRAVENOUS

## 2017-12-17 NOTE — ED Notes (Signed)
Patient transported to X-ray 

## 2017-12-17 NOTE — Discharge Instructions (Signed)
It is very important to take your seizure medicine as directed.  Missing even 1 dose can increase her chance to have a seizure.  Because of this seizure, you cannot drive for the next 6 months and need to follow-up with a neurologist.  You will also need to follow-up with a primary care physician for refills of your seizure medicine.  Because of your history of WPW/Wolff-Parkinson-White you need to follow-up with a cardiologist.

## 2017-12-17 NOTE — ED Triage Notes (Signed)
[facility] reports the pt was laying in bed when he began to have a seizure, witnessed by his wife. Wife report to EMS that seizure on lasted about a minute. EMS reports that pt has been off his keppra for 5 months.
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
## 2018-03-08 ENCOUNTER — Emergency Department (HOSPITAL_COMMUNITY): Payer: Medicaid Other

## 2018-03-08 ENCOUNTER — Encounter (HOSPITAL_COMMUNITY): Payer: Self-pay

## 2018-03-08 ENCOUNTER — Emergency Department (HOSPITAL_COMMUNITY)
Admission: EM | Admit: 2018-03-08 | Discharge: 2018-03-08 | Disposition: A | Discharge status: Home / Self Care | Payer: Medicaid Other | Attending: Emergency Medicine | Admitting: Emergency Medicine

## 2018-03-08 DIAGNOSIS — Z87820 Personal history of traumatic brain injury: Secondary | ICD-10-CM | POA: Insufficient documentation

## 2018-03-08 DIAGNOSIS — F17210 Nicotine dependence, cigarettes, uncomplicated: Secondary | ICD-10-CM | POA: Diagnosis not present

## 2018-03-08 DIAGNOSIS — G40909 Epilepsy, unspecified, not intractable, without status epilepticus: Secondary | ICD-10-CM | POA: Diagnosis not present

## 2018-03-08 DIAGNOSIS — J45909 Unspecified asthma, uncomplicated: Secondary | ICD-10-CM | POA: Diagnosis not present

## 2018-03-08 DIAGNOSIS — I456 Pre-excitation syndrome: Secondary | ICD-10-CM | POA: Diagnosis not present

## 2018-03-08 DIAGNOSIS — R569 Unspecified convulsions: Secondary | ICD-10-CM | POA: Diagnosis present

## 2018-03-08 LAB — CBC WITH DIFFERENTIAL/PLATELET
BASOS ABS: 0 10*3/uL (ref 0.0–0.1)
Basophils Relative: 1 %
EOS PCT: 3 %
Eosinophils Absolute: 0.3 10*3/uL (ref 0.0–0.7)
HEMATOCRIT: 42.7 % (ref 39.0–52.0)
HEMOGLOBIN: 14.2 g/dL (ref 13.0–17.0)
LYMPHS PCT: 27 %
Lymphs Abs: 2.2 10*3/uL (ref 0.7–4.0)
MCH: 32.3 pg (ref 26.0–34.0)
MCHC: 33.3 g/dL (ref 30.0–36.0)
MCV: 97 fL (ref 78.0–100.0)
Monocytes Absolute: 0.7 10*3/uL (ref 0.1–1.0)
Monocytes Relative: 9 %
NEUTROS ABS: 4.9 10*3/uL (ref 1.7–7.7)
NEUTROS PCT: 60 %
Platelets: 219 10*3/uL (ref 150–400)
RBC: 4.4 MIL/uL (ref 4.22–5.81)
RDW: 14.4 % (ref 11.5–15.5)
WBC: 8.1 10*3/uL (ref 4.0–10.5)

## 2018-03-08 LAB — COMPREHENSIVE METABOLIC PANEL
ALT: 22 U/L (ref 17–63)
AST: 23 U/L (ref 15–41)
Albumin: 4 g/dL (ref 3.5–5.0)
Alkaline Phosphatase: 38 U/L (ref 38–126)
Anion gap: 8 (ref 5–15)
BUN: 12 mg/dL (ref 6–20)
CHLORIDE: 110 mmol/L (ref 101–111)
CO2: 22 mmol/L (ref 22–32)
Calcium: 9.2 mg/dL (ref 8.9–10.3)
Creatinine, Ser: 0.9 mg/dL (ref 0.61–1.24)
Glucose, Bld: 99 mg/dL (ref 65–99)
POTASSIUM: 4.5 mmol/L (ref 3.5–5.1)
SODIUM: 140 mmol/L (ref 135–145)
Total Bilirubin: 0.3 mg/dL (ref 0.3–1.2)
Total Protein: 7.1 g/dL (ref 6.5–8.1)

## 2018-03-08 LAB — RAPID URINE DRUG SCREEN, HOSP PERFORMED
AMPHETAMINES: NOT DETECTED
BENZODIAZEPINES: NOT DETECTED
Barbiturates: NOT DETECTED
Cocaine: NOT DETECTED
Opiates: NOT DETECTED
TETRAHYDROCANNABINOL: POSITIVE — AB

## 2018-03-08 LAB — ETHANOL

## 2018-03-08 MED ORDER — SODIUM CHLORIDE 0.9 % IV BOLUS
500.0000 mL | Freq: Once | INTRAVENOUS | Status: AC
  Administered 2018-03-08: 500 mL via INTRAVENOUS

## 2018-03-08 MED ORDER — ACETAMINOPHEN 325 MG PO TABS
650.0000 mg | ORAL_TABLET | Freq: Once | ORAL | Status: AC
  Administered 2018-03-08: 650 mg via ORAL
  Filled 2018-03-08: qty 2

## 2018-03-08 MED ORDER — LEVETIRACETAM 500 MG PO TABS: 500.0000 mg | ORAL_TABLET | Freq: Two times a day (BID) | ORAL | 0 refills | Status: DC

## 2018-03-08 MED ORDER — LEVETIRACETAM IN NACL 1000 MG/100ML IV SOLN
1000.0000 mg | Freq: Once | INTRAVENOUS | Status: AC
  Administered 2018-03-08: 1000 mg via INTRAVENOUS
  Filled 2018-03-08: qty 100

## 2018-03-08 NOTE — Discharge Instructions (Signed)

## 2018-03-08 NOTE — ED Triage Notes (Signed)
Patient coming from home with c/o seizure. This was unwitnessed seizure. Pt been off the keppra for 5 days and he had multiple seizure. Pt fell to the floor. Patient state hematoma it been there three days a go but it had got big.
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
## 2018-03-08 NOTE — Care Management Note (Signed)
Case Management Note  CM consulted for no pcp.  Placed information on AVS [facility].  Spoke with Dr. Jacqulyn BathLong.  No further CM needs noted at this time.

## 2018-03-08 NOTE — ED Notes (Signed)
Bed: WA24 Expected date:  Expected time:  Means of arrival:  Comments: 

## 2018-03-08 NOTE — ED Notes (Signed)
Patient transported to CT 

## 2018-03-29 ENCOUNTER — Other Ambulatory Visit: Payer: Self-pay

## 2018-03-29 ENCOUNTER — Emergency Department (HOSPITAL_COMMUNITY): Payer: Medicaid Other

## 2018-03-29 ENCOUNTER — Encounter (HOSPITAL_COMMUNITY): Payer: Self-pay | Admitting: Emergency Medicine

## 2018-03-29 ENCOUNTER — Emergency Department (HOSPITAL_COMMUNITY)
Admission: EM | Admit: 2018-03-29 | Discharge: 2018-03-29 | Disposition: A | Discharge status: Home / Self Care | Payer: Medicaid Other | Attending: Emergency Medicine | Admitting: Emergency Medicine

## 2018-03-29 DIAGNOSIS — G40909 Epilepsy, unspecified, not intractable, without status epilepticus: Secondary | ICD-10-CM | POA: Diagnosis not present

## 2018-03-29 DIAGNOSIS — J45901 Unspecified asthma with (acute) exacerbation: Secondary | ICD-10-CM

## 2018-03-29 LAB — BASIC METABOLIC PANEL
ANION GAP: 8 (ref 5–15)
BUN: 15 mg/dL (ref 6–20)
CALCIUM: 8.7 mg/dL — AB (ref 8.9–10.3)
CHLORIDE: 108 mmol/L (ref 101–111)
CO2: 26 mmol/L (ref 22–32)
CREATININE: 1.3 mg/dL — AB (ref 0.61–1.24)
GFR calc Af Amer: >60 mL/min (ref >60)
Glucose, Bld: 87 mg/dL (ref 65–99)
Potassium: 4.1 mmol/L (ref 3.5–5.1)
Sodium: 142 mmol/L (ref 135–145)

## 2018-03-29 LAB — CBC WITH DIFFERENTIAL/PLATELET
BASOS PCT: 0 %
Basophils Absolute: 0 10*3/uL (ref 0.0–0.1)
EOS ABS: 0.2 10*3/uL (ref 0.0–0.7)
EOS PCT: 2 %
HCT: 40.8 % (ref 39.0–52.0)
HEMOGLOBIN: 13.6 g/dL (ref 13.0–17.0)
Lymphocytes Relative: 21 %
Lymphs Abs: 1.9 10*3/uL (ref 0.7–4.0)
MCH: 32 pg (ref 26.0–34.0)
MCHC: 33.3 g/dL (ref 30.0–36.0)
MCV: 96 fL (ref 78.0–100.0)
Monocytes Absolute: 0.6 10*3/uL (ref 0.1–1.0)
Monocytes Relative: 7 %
NEUTROS PCT: 70 %
Neutro Abs: 6.3 10*3/uL (ref 1.7–7.7)
PLATELETS: 242 10*3/uL (ref 150–400)
RBC: 4.25 MIL/uL (ref 4.22–5.81)
RDW: 14.5 % (ref 11.5–15.5)
WBC: 9 10*3/uL (ref 4.0–10.5)

## 2018-03-29 LAB — BRAIN NATRIURETIC PEPTIDE: B Natriuretic Peptide: 18.7 pg/mL (ref 0.0–100.0)

## 2018-03-29 LAB — I-STAT TROPONIN, ED: TROPONIN I, POC: 0 ng/mL (ref 0.00–0.08)

## 2018-03-29 MED ORDER — LEVETIRACETAM 500 MG PO TABS
1000.0000 mg | ORAL_TABLET | Freq: Once | ORAL | Status: AC
  Administered 2018-03-29: 1000 mg via ORAL
  Filled 2018-03-29: qty 2

## 2018-03-29 MED ORDER — SODIUM CHLORIDE 0.9 % IV BOLUS
500.0000 mL | Freq: Once | INTRAVENOUS | Status: AC
  Administered 2018-03-29: 500 mL via INTRAVENOUS

## 2018-03-29 MED ORDER — PREDNISONE 20 MG PO TABS: 60.0000 mg | ORAL_TABLET | Freq: Every day | ORAL | 0 refills | Status: DC

## 2018-03-29 MED ORDER — LEVETIRACETAM 500 MG PO TABS: 500.0000 mg | ORAL_TABLET | Freq: Two times a day (BID) | ORAL | 0 refills | Status: DC

## 2018-03-29 MED ORDER — IPRATROPIUM-ALBUTEROL 0.5-2.5 (3) MG/3ML IN SOLN
3.0000 mL | Freq: Once | RESPIRATORY_TRACT | Status: AC
  Administered 2018-03-29: 3 mL via RESPIRATORY_TRACT
  Filled 2018-03-29: qty 3

## 2018-03-29 MED ORDER — METHYLPREDNISOLONE SODIUM SUCC 125 MG IJ SOLR
125.0000 mg | Freq: Once | INTRAMUSCULAR | Status: AC
  Administered 2018-03-29: 125 mg via INTRAVENOUS
  Filled 2018-03-29: qty 2

## 2018-03-29 MED ORDER — METOCLOPRAMIDE HCL 5 MG/ML IJ SOLN
10.0000 mg | Freq: Once | INTRAMUSCULAR | Status: DC
  Filled 2018-03-29: qty 2

## 2018-03-29 MED ORDER — ALBUTEROL SULFATE HFA 108 (90 BASE) MCG/ACT IN AERS: 1.0000 | INHALATION_SPRAY | Freq: Four times a day (QID) | RESPIRATORY_TRACT | 0 refills | Status: AC | PRN

## 2018-03-29 MED ORDER — KETOROLAC TROMETHAMINE 30 MG/ML IJ SOLN
30.0000 mg | Freq: Once | INTRAMUSCULAR | Status: AC
  Administered 2018-03-29: 30 mg via INTRAVENOUS
  Filled 2018-03-29: qty 1

## 2018-03-29 NOTE — ED Notes (Signed)
Patient transported to X-ray 

## 2018-03-29 NOTE — ED Triage Notes (Signed)
Per EMS pt coming from home with complaints of sudden chest pain and shortness of breath starting around 0100. Pt explained that he had a witnessed seizure today at 4pm and claims he hit his head.Pt has hx of seizures and has not taken his keppra in a month. Pt explains he has had URI symptoms for a week now. Pt received 324 ASA,  albuterol, and  atrovnt via EMS.

## 2018-03-29 NOTE — ED Notes (Signed)
ED Provider at bedside. 

## 2018-03-29 NOTE — ED Notes (Signed)
Patient ambulated with pulse oximetry. Patient was able to ambulate independently on room air without difficulty while maintaining SpO2 99-100%.

## 2018-03-29 NOTE — Discharge Instructions (Signed)
Medications: Prednisone, albuterol inhaler, Keppra  Treatment: Take prednisone for the next 5 days.  Use albuterol inhaler every 4-6 hours as needed for shortness of breath or wheezing.  Resume taking Keppra.  Do not drive for 6 months until seizure-free.  Follow-up: Please follow-up with neurology for further management of your seizures.  Please follow-up with your regular doctor for further management of your asthma.  You can also follow-up with cardiology for further management of your WPW

## 2018-03-29 NOTE — ED Provider Notes (Signed)
Signout from Leary Roca, PA-C  Patient presenting with 3-day history of upper respiratory symptoms.  He developed shortness of breath.  He has history of asthma.  Patient currently being given nebulizer with IV Solu-Medrol.  Patient also had a seizure yesterday around 4 PM and hit his head.  Patient is subtherapeutic on his Keppra, he has not been taking it for month.  Labs, chest x-ray, CT head pending.  Plan to load with Keppra if CT negative.  If labs are negative, discharge home.  CBC unremarkable.  Creatinine 1.30, calcium 8.7.  Troponin negative.  CT head is negative.  Chest x-ray shows hyperinflation of the left lung base atelectasis.  On reassessment after nebulizer, patient states he is breathing much better.  Lungs are clear.  He asked for something for headache, which has bothered him since he hit his head yesterday after his seizure.  CT head is negative.  Will give Toradol.  Patient is feeling much better.  Lungs continue to be clear.  He ambulated 100% on room air.  His headache is resolved after Toradol and eating.  Patient advised to follow-up with neurology for further management of his seizures.  He is given precautions not to drive for 6 months.  Patient given Keppra prescription and advised to resume taking it.  Will give 5-day burst of prednisone for asthma exacerbation as well as albuterol inhaler.  Return precautions discussed.  Patient understands and agrees with plan.  Patient vitals stable throughout ED course and discharged in satisfactory condition.   Emi Holes, PA-C 03/29/18 0930

## 2018-03-29 NOTE — ED Notes (Signed)
Patient transported to CT 
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
## 2018-08-05 ENCOUNTER — Emergency Department (HOSPITAL_COMMUNITY)
Admission: EM | Admit: 2018-08-05 | Discharge: 2018-08-05 | Discharge status: Against Medical Advice | Payer: Medicaid Other | Attending: Emergency Medicine | Admitting: Emergency Medicine

## 2018-08-05 ENCOUNTER — Encounter (HOSPITAL_COMMUNITY): Payer: Self-pay

## 2018-08-05 ENCOUNTER — Other Ambulatory Visit: Payer: Self-pay

## 2018-08-05 DIAGNOSIS — Z5320 Procedure and treatment not carried out because of patient's decision for unspecified reasons: Secondary | ICD-10-CM | POA: Insufficient documentation

## 2018-08-05 DIAGNOSIS — R51 Headache: Secondary | ICD-10-CM | POA: Insufficient documentation

## 2018-08-05 DIAGNOSIS — F17210 Nicotine dependence, cigarettes, uncomplicated: Secondary | ICD-10-CM | POA: Diagnosis not present

## 2018-08-05 DIAGNOSIS — F1290 Cannabis use, unspecified, uncomplicated: Secondary | ICD-10-CM | POA: Diagnosis not present

## 2018-08-05 DIAGNOSIS — R569 Unspecified convulsions: Secondary | ICD-10-CM | POA: Diagnosis not present

## 2018-08-05 LAB — RAPID URINE DRUG SCREEN, HOSP PERFORMED
AMPHETAMINES: NOT DETECTED
BARBITURATES: NOT DETECTED
BENZODIAZEPINES: NOT DETECTED
Cocaine: NOT DETECTED
Opiates: NOT DETECTED
TETRAHYDROCANNABINOL: POSITIVE — AB

## 2018-08-05 LAB — CBC WITH DIFFERENTIAL/PLATELET
BASOS ABS: 0 10*3/uL (ref 0.0–0.1)
BASOS PCT: 0 %
Eosinophils Absolute: 0 10*3/uL (ref 0.0–0.7)
Eosinophils Relative: 0 %
HCT: 42.3 % (ref 39.0–52.0)
Hemoglobin: 13.8 g/dL (ref 13.0–17.0)
LYMPHS PCT: 18 %
Lymphs Abs: 1.7 10*3/uL (ref 0.7–4.0)
MCH: 32.5 pg (ref 26.0–34.0)
MCHC: 32.6 g/dL (ref 30.0–36.0)
MCV: 99.5 fL (ref 78.0–100.0)
MONO ABS: 0.7 10*3/uL (ref 0.1–1.0)
Monocytes Relative: 7 %
Neutro Abs: 7 10*3/uL (ref 1.7–7.7)
Neutrophils Relative %: 75 %
PLATELETS: 193 10*3/uL (ref 150–400)
RBC: 4.25 MIL/uL (ref 4.22–5.81)
RDW: 14.9 % (ref 11.5–15.5)
WBC: 9.4 10*3/uL (ref 4.0–10.5)

## 2018-08-05 LAB — COMPREHENSIVE METABOLIC PANEL
ALBUMIN: 3.9 g/dL (ref 3.5–5.0)
ALT: 21 U/L (ref 0–44)
AST: 27 U/L (ref 15–41)
Alkaline Phosphatase: 35 U/L — ABNORMAL LOW (ref 38–126)
Anion gap: 10 (ref 5–15)
BUN: 10 mg/dL (ref 6–20)
CHLORIDE: 108 mmol/L (ref 98–111)
CO2: 23 mmol/L (ref 22–32)
CREATININE: 0.83 mg/dL (ref 0.61–1.24)
Calcium: 8.9 mg/dL (ref 8.9–10.3)
GFR calc Af Amer: >60 mL/min (ref >60)
GFR calc non Af Amer: >60 mL/min (ref >60)
GLUCOSE: 80 mg/dL (ref 70–99)
Potassium: 4 mmol/L (ref 3.5–5.1)
Sodium: 141 mmol/L (ref 135–145)
Total Bilirubin: 0.8 mg/dL (ref 0.3–1.2)
Total Protein: 6.9 g/dL (ref 6.5–8.1)

## 2018-08-05 LAB — CBG MONITORING, ED: GLUCOSE-CAPILLARY: 118 mg/dL — AB (ref 70–99)

## 2018-08-05 LAB — ETHANOL: Alcohol, Ethyl (B): <10 mg/dL (ref <10)

## 2018-08-05 MED ORDER — LEVETIRACETAM IN NACL 1000 MG/100ML IV SOLN
1000.0000 mg | Freq: Once | INTRAVENOUS | Status: AC
  Administered 2018-08-05: 1000 mg via INTRAVENOUS
  Filled 2018-08-05: qty 100

## 2018-08-05 NOTE — ED Notes (Signed)
IV access no blood was obtained.
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
## 2018-08-05 NOTE — ED Notes (Signed)
Bed: WA25 Expected date:  Expected time:  Means of arrival:  Comments: EMS-SZ 

## 2018-08-05 NOTE — ED Notes (Signed)
IV team in pt room at this time

## 2018-08-05 NOTE — ED Notes (Signed)
1819 IV u/s attempted x 2 no access.

## 2018-08-05 NOTE — ED Notes (Signed)
Placed pt on 2 lpm via [place]. Pt O2 saturations were 82-90%

## 2018-08-05 NOTE — ED Triage Notes (Signed)
7Pt arrived from home. Pt wife reports 2 witnessed SZ this AM lasting 90 secs,., and again today around 1 pm. Pt has had some changes in mental status is alert and oriented to self, and follows commands. Pt takes Keppra and is non-compliant per Pt wife.  EMS v/s 128/90, HR 72, RR 16 , 97% RA  Pt has HX of STEMI WPW per EMS on EKG

## 2018-10-10 ENCOUNTER — Emergency Department (HOSPITAL_COMMUNITY): Payer: Medicaid Other

## 2018-10-10 ENCOUNTER — Emergency Department (HOSPITAL_COMMUNITY)
Admission: EM | Admit: 2018-10-10 | Discharge: 2018-10-10 | Disposition: A | Discharge status: Home / Self Care | Payer: Medicaid Other | Attending: Emergency Medicine | Admitting: Emergency Medicine

## 2018-10-10 DIAGNOSIS — R569 Unspecified convulsions: Secondary | ICD-10-CM | POA: Insufficient documentation

## 2018-10-10 DIAGNOSIS — R0789 Other chest pain: Secondary | ICD-10-CM | POA: Insufficient documentation

## 2018-10-10 DIAGNOSIS — J45909 Unspecified asthma, uncomplicated: Secondary | ICD-10-CM | POA: Insufficient documentation

## 2018-10-10 DIAGNOSIS — F17210 Nicotine dependence, cigarettes, uncomplicated: Secondary | ICD-10-CM | POA: Diagnosis not present

## 2018-10-10 LAB — CBC WITH DIFFERENTIAL/PLATELET
Abs Immature Granulocytes: 0.02 10*3/uL (ref 0.00–0.07)
BASOS PCT: 1 %
Basophils Absolute: 0.1 10*3/uL (ref 0.0–0.1)
Eosinophils Absolute: 0.1 10*3/uL (ref 0.0–0.5)
Eosinophils Relative: 1 %
HEMATOCRIT: 43.3 % (ref 39.0–52.0)
Hemoglobin: 13.8 g/dL (ref 13.0–17.0)
Immature Granulocytes: 0 %
LYMPHS PCT: 23 %
Lymphs Abs: 2.1 10*3/uL (ref 0.7–4.0)
MCH: 31.4 pg (ref 26.0–34.0)
MCHC: 31.9 g/dL (ref 30.0–36.0)
MCV: 98.6 fL (ref 80.0–100.0)
MONO ABS: 0.9 10*3/uL (ref 0.1–1.0)
Monocytes Relative: 10 %
NEUTROS ABS: 6.1 10*3/uL (ref 1.7–7.7)
NRBC: 0 % (ref 0.0–0.2)
Neutrophils Relative %: 65 %
Platelets: 226 10*3/uL (ref 150–400)
RBC: 4.39 MIL/uL (ref 4.22–5.81)
RDW: 14.3 % (ref 11.5–15.5)
WBC: 9.3 10*3/uL (ref 4.0–10.5)

## 2018-10-10 LAB — BASIC METABOLIC PANEL
Anion gap: 8 (ref 5–15)
BUN: 14 mg/dL (ref 6–20)
CO2: 22 mmol/L (ref 22–32)
CREATININE: 0.79 mg/dL (ref 0.61–1.24)
Calcium: 8.7 mg/dL — ABNORMAL LOW (ref 8.9–10.3)
Chloride: 107 mmol/L (ref 98–111)
GFR calc Af Amer: >60 mL/min (ref >60)
GFR calc non Af Amer: >60 mL/min (ref >60)
GLUCOSE: 80 mg/dL (ref 70–99)
Potassium: 4.6 mmol/L (ref 3.5–5.1)
Sodium: 137 mmol/L (ref 135–145)

## 2018-10-10 LAB — TROPONIN I: Troponin I: <0.03 ng/mL (ref <0.03)

## 2018-10-10 MED ORDER — LEVETIRACETAM IN NACL 1000 MG/100ML IV SOLN
1000.0000 mg | Freq: Once | INTRAVENOUS | Status: AC
  Administered 2018-10-10: 1000 mg via INTRAVENOUS
  Filled 2018-10-10: qty 100

## 2018-10-10 MED ORDER — LEVETIRACETAM 500 MG PO TABS: 500.0000 mg | ORAL_TABLET | Freq: Two times a day (BID) | ORAL | 0 refills | Status: DC

## 2018-10-10 MED ORDER — METHOCARBAMOL 500 MG PO TABS: 1000.0000 mg | ORAL_TABLET | Freq: Three times a day (TID) | ORAL | 0 refills | Status: AC | PRN

## 2018-10-10 NOTE — ED Notes (Signed)
Patient transported to X-ray 

## 2018-10-10 NOTE — ED Notes (Signed)
Pt verbalized understanding of discharge paperwork, prescriptions and follow-up care. Given bus pass
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
## 2018-10-10 NOTE — ED Triage Notes (Addendum)
Pt to ED from home after four grand mal seizures since 4am per family with hx of the same. No meds in two months d/t no PCP since he moved here. Pt did not fall, had all seizures on a mattress. Last seizure was one month ago before this. C/o headache and dizziness at present. Pt slow to respond but A/O x4. Pt took 243 ASA before EMS arrival.

## 2019-01-06 ENCOUNTER — Telehealth: Payer: Self-pay
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
## 2019-01-07 ENCOUNTER — Ambulatory Visit: Payer: Medicaid Other | Admitting: Neurology

## 2019-11-07 IMAGING — CT CT HEAD W/O CM
3 series · 15 of 47 positions shown, 18 images · non-contrast
Comparison: 11/04/2016

CLINICAL DATA: Seizure, head trauma

EXAM:
CT HEAD WITHOUT CONTRAST
TECHNIQUE: Contiguous axial images were obtained from the base of the skull
through the vertex without intravenous contrast.

[Series 2: head wo · axial · 0.47mm/px · z∈[-185,-60]mm · 9 of 31 slices shown, 12 images]
[im 3/31  brain]
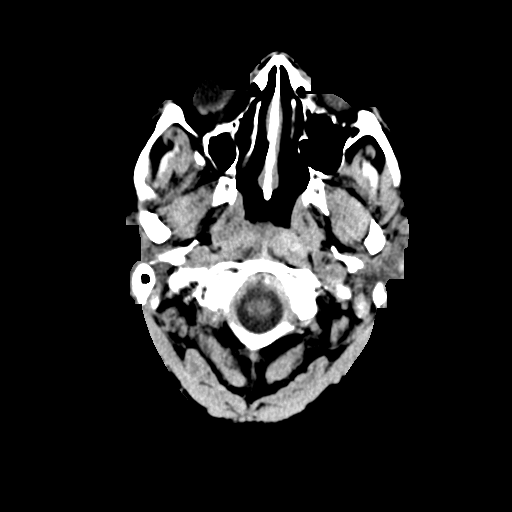
[im 3/31  bone]
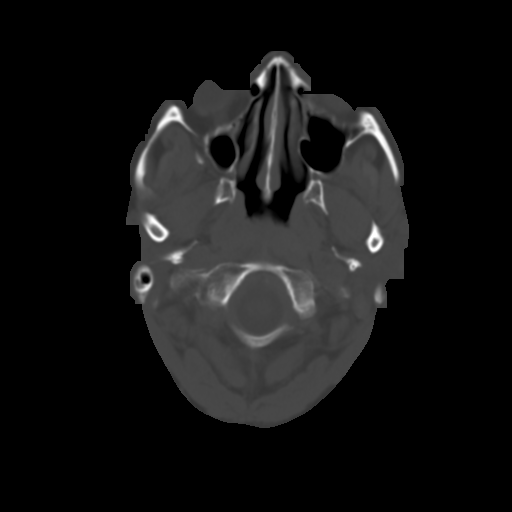
[im 6/31  brain]
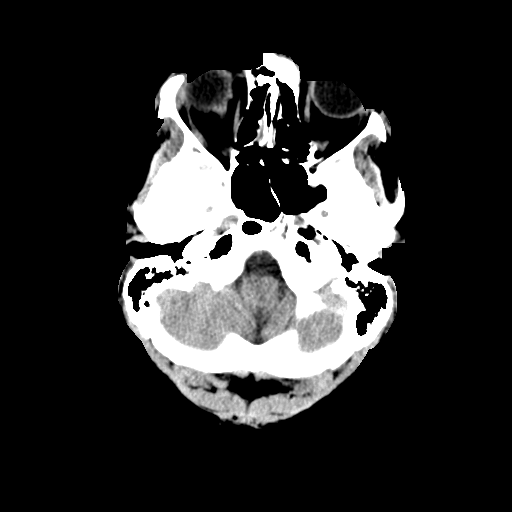
[im 9/31  brain]
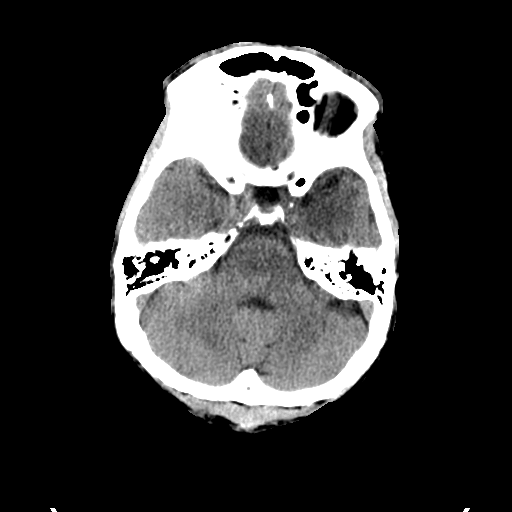
[im 12/31  brain]
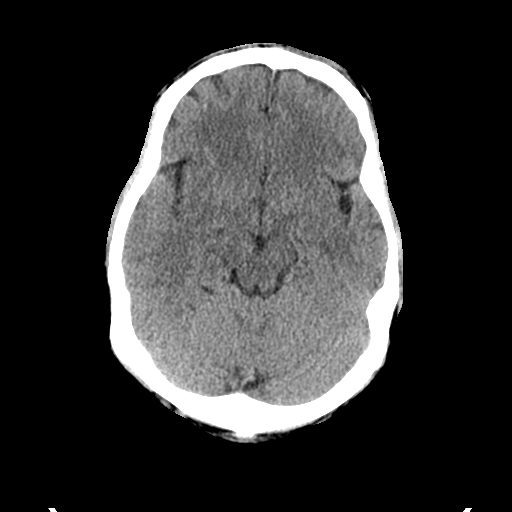
[im 16/31  brain]
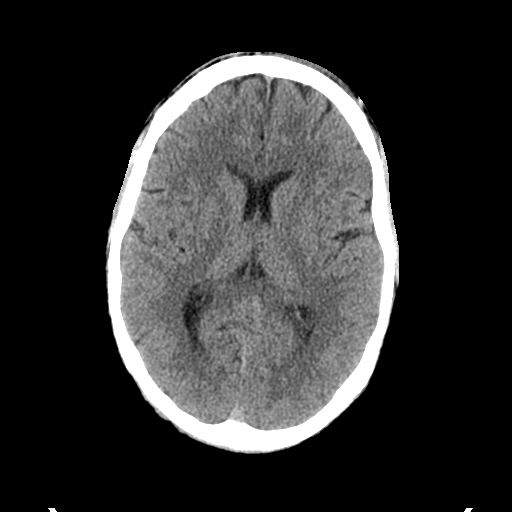
[im 16/31  bone]
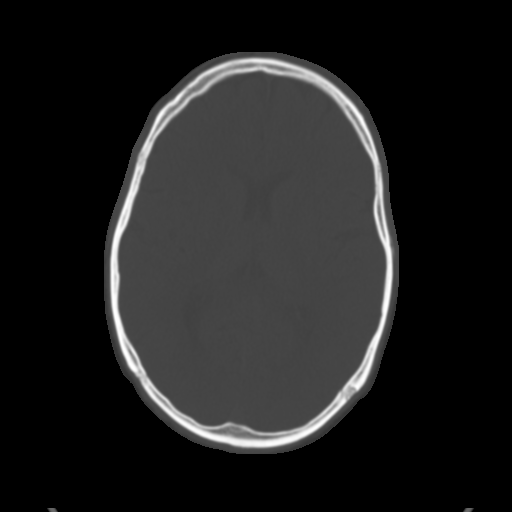
[im 19/31  brain]
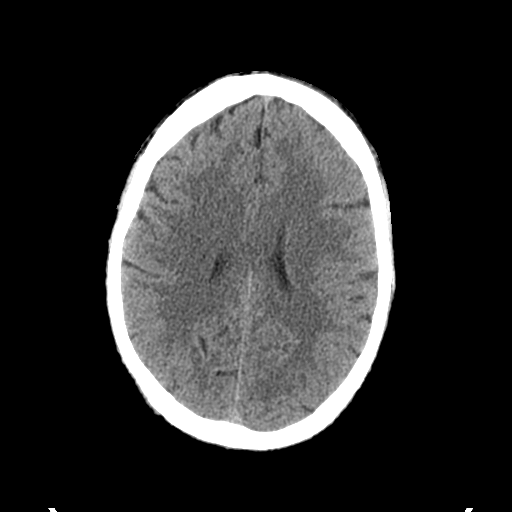
[im 22/31  brain]
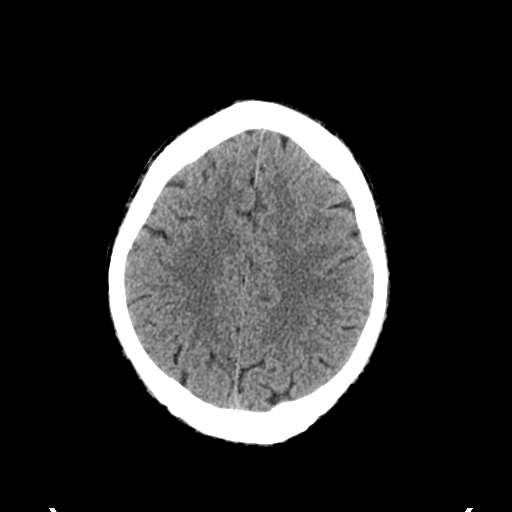
[im 25/31  brain]
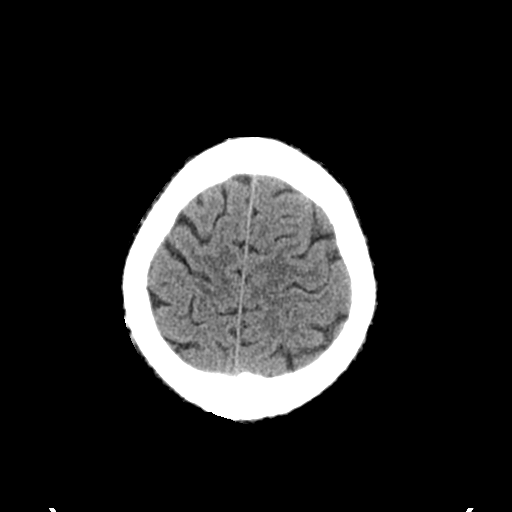
[im 28/31  brain]
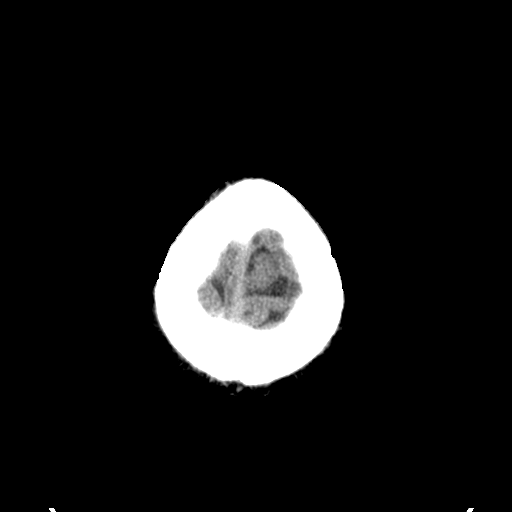
[im 28/31  bone]
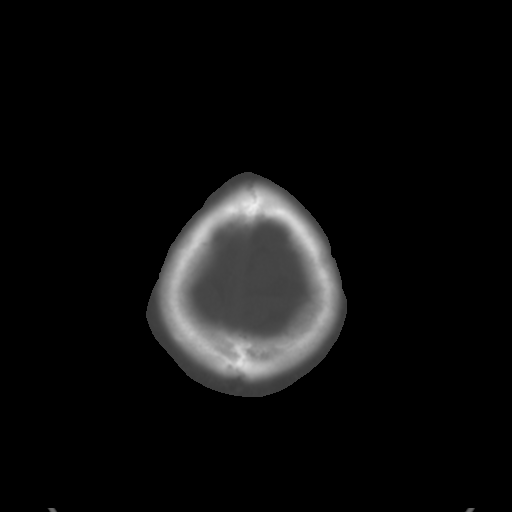

[Series 5: coronal soft tissue · coronal · 0.30mm/px · 3 of 70 slices shown]
[im 24/70  brain]
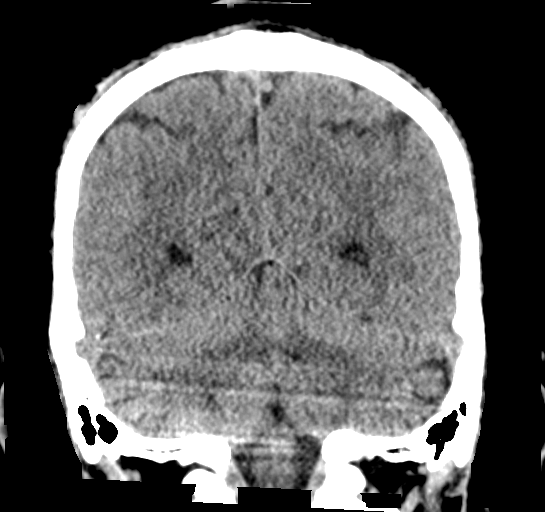
[im 31/70  brain]
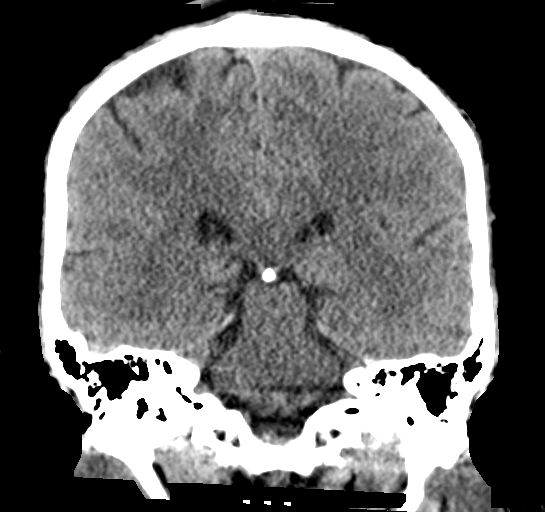
[im 39/70  brain]
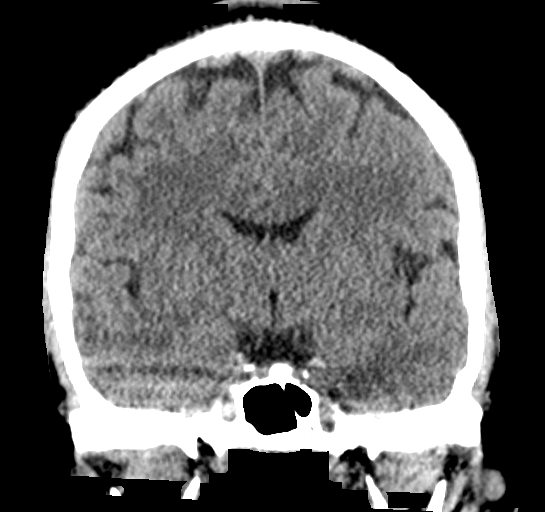

[Series 6: sagittal soft tissue · sagittal · 0.31mm/px · 3 of 58 slices shown]
[im 20/58  brain]
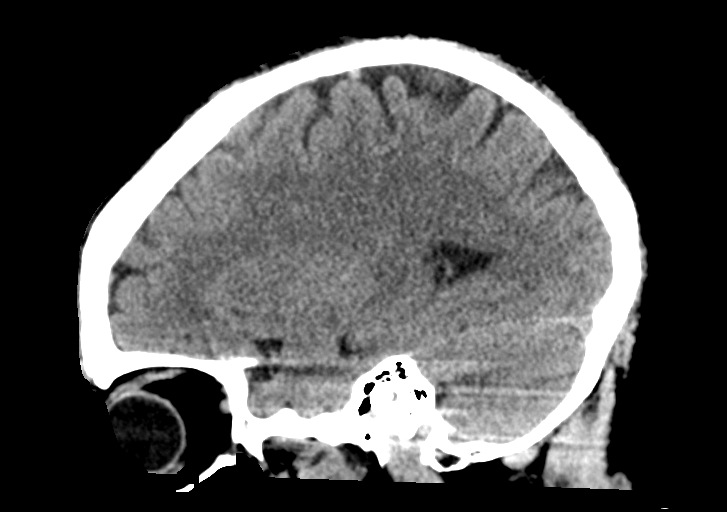
[im 29/58  brain]
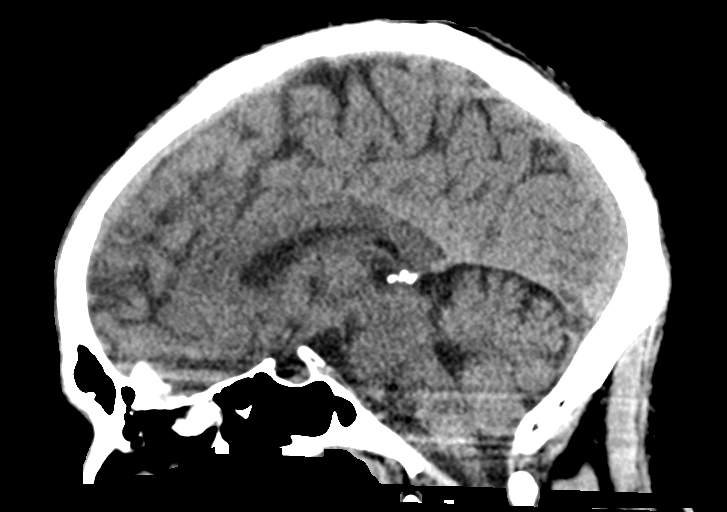
[im 39/58  brain]
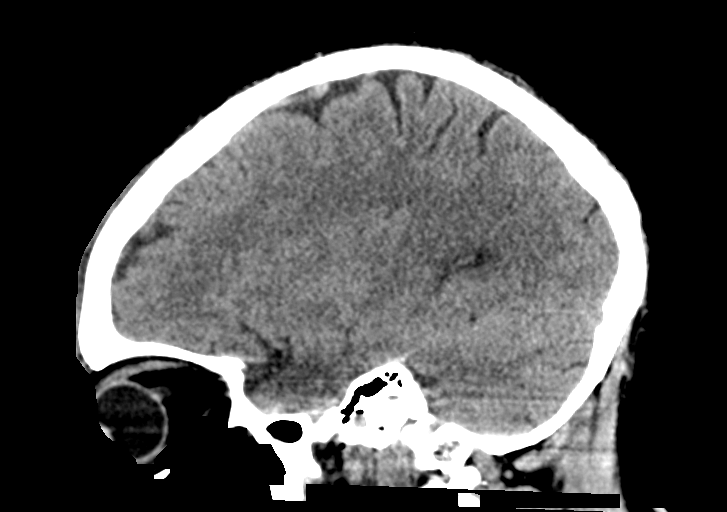

[15 of 47 positions shown; findings below may reference images not displayed]

FINDINGS: Brain: No evidence of acute infarction, hemorrhage, hydrocephalus,
extra-axial collection or mass lesion/mass effect.

Vascular: Negative for hyperdense vessel

Skull: Negative for skull fracture.  Left frontal scalp hematoma

Sinuses/Orbits: Mild mucosal edema paranasal sinuses.  Normal orbit

Other: None
IMPRESSION: Normal CT of the brain.  Small left frontal scalp hematoma

## 2019-12-13 ENCOUNTER — Encounter: Payer: Self-pay | Admitting: *Deleted
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
## 2020-01-11 ENCOUNTER — Other Ambulatory Visit: Payer: Self-pay

## 2020-01-11 DIAGNOSIS — Z20822 Contact with and (suspected) exposure to covid-19: Secondary | ICD-10-CM

## 2020-01-12 LAB — NOVEL CORONAVIRUS, NAA: SARS-CoV-2, NAA: NOT DETECTED

## 2020-02-07 ENCOUNTER — Encounter: Payer: Self-pay | Admitting: *Deleted
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
## 2020-06-01 ENCOUNTER — Ambulatory Visit (INDEPENDENT_AMBULATORY_CARE_PROVIDER_SITE_OTHER): Payer: Medicaid Other | Admitting: Primary Care

## 2021-05-02 ENCOUNTER — Other Ambulatory Visit (HOSPITAL_COMMUNITY): Payer: Self-pay

## 2021-05-02 ENCOUNTER — Emergency Department (HOSPITAL_COMMUNITY)
Admission: EM | Admit: 2021-05-02 | Discharge: 2021-05-02 | Disposition: A | Discharge status: Home / Self Care | Payer: Medicaid - Out of State | Attending: Emergency Medicine | Admitting: Emergency Medicine

## 2021-05-02 ENCOUNTER — Emergency Department (HOSPITAL_COMMUNITY): Payer: Medicaid - Out of State

## 2021-05-02 ENCOUNTER — Encounter (HOSPITAL_COMMUNITY): Payer: Self-pay

## 2021-05-02 ENCOUNTER — Other Ambulatory Visit: Payer: Self-pay

## 2021-05-02 DIAGNOSIS — F17210 Nicotine dependence, cigarettes, uncomplicated: Secondary | ICD-10-CM | POA: Insufficient documentation

## 2021-05-02 DIAGNOSIS — R569 Unspecified convulsions: Secondary | ICD-10-CM | POA: Insufficient documentation

## 2021-05-02 DIAGNOSIS — J45909 Unspecified asthma, uncomplicated: Secondary | ICD-10-CM | POA: Diagnosis not present

## 2021-05-02 LAB — COMPREHENSIVE METABOLIC PANEL
ALT: 15 U/L (ref 0–44)
AST: 27 U/L (ref 15–41)
Albumin: 3.6 g/dL (ref 3.5–5.0)
Alkaline Phosphatase: 32 U/L — ABNORMAL LOW (ref 38–126)
Anion gap: 9 (ref 5–15)
BUN: 10 mg/dL (ref 6–20)
CO2: 20 mmol/L — ABNORMAL LOW (ref 22–32)
Calcium: 8.7 mg/dL — ABNORMAL LOW (ref 8.9–10.3)
Chloride: 107 mmol/L (ref 98–111)
Creatinine, Ser: 1.19 mg/dL (ref 0.61–1.24)
GFR, Estimated: >60 mL/min (ref >60)
Glucose, Bld: 115 mg/dL — ABNORMAL HIGH (ref 70–99)
Potassium: 4.7 mmol/L (ref 3.5–5.1)
Sodium: 136 mmol/L (ref 135–145)
Total Bilirubin: 0.6 mg/dL (ref 0.3–1.2)
Total Protein: 5.9 g/dL — ABNORMAL LOW (ref 6.5–8.1)

## 2021-05-02 LAB — CBC WITH DIFFERENTIAL/PLATELET
Abs Immature Granulocytes: 0.02 10*3/uL (ref 0.00–0.07)
Basophils Absolute: 0.1 10*3/uL (ref 0.0–0.1)
Basophils Relative: 1 %
Eosinophils Absolute: 0.2 10*3/uL (ref 0.0–0.5)
Eosinophils Relative: 3 %
HCT: 41.7 % (ref 39.0–52.0)
Hemoglobin: 13.5 g/dL (ref 13.0–17.0)
Immature Granulocytes: 0 %
Lymphocytes Relative: 39 %
Lymphs Abs: 2.3 10*3/uL (ref 0.7–4.0)
MCH: 32.7 pg (ref 26.0–34.0)
MCHC: 32.4 g/dL (ref 30.0–36.0)
MCV: 101 fL — ABNORMAL HIGH (ref 80.0–100.0)
Monocytes Absolute: 0.6 10*3/uL (ref 0.1–1.0)
Monocytes Relative: 10 %
Neutro Abs: 2.7 10*3/uL (ref 1.7–7.7)
Neutrophils Relative %: 47 %
Platelets: 216 10*3/uL (ref 150–400)
RBC: 4.13 MIL/uL — ABNORMAL LOW (ref 4.22–5.81)
RDW: 14.2 % (ref 11.5–15.5)
WBC: 5.8 10*3/uL (ref 4.0–10.5)
nRBC: 0 % (ref 0.0–0.2)

## 2021-05-02 MED ORDER — LEVETIRACETAM IN NACL 1000 MG/100ML IV SOLN
1000.0000 mg | Freq: Once | INTRAVENOUS | Status: AC
  Administered 2021-05-02: 1000 mg via INTRAVENOUS
  Filled 2021-05-02: qty 100

## 2021-05-02 MED ORDER — LEVETIRACETAM 500 MG PO TABS: 500.0000 mg | ORAL_TABLET | Freq: Two times a day (BID) | ORAL | 0 refills | Status: DC

## 2021-05-02 MED ORDER — LEVETIRACETAM 500 MG PO TABS
500.0000 mg | ORAL_TABLET | Freq: Two times a day (BID) | ORAL | 0 refills | Status: AC
  Filled 2021-05-02: qty 60, 30d supply, fill #0

## 2021-05-02 MED ORDER — SODIUM CHLORIDE 0.9 % IV BOLUS
1000.0000 mL | Freq: Once | INTRAVENOUS | Status: AC
  Administered 2021-05-02: 1000 mL via INTRAVENOUS
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
## 2021-05-02 NOTE — Discharge Instructions (Addendum)
You were seen today in the emergency department for seizure.  You were given fluids and Keppra.  The head CT showed no signs of an acute fracture to the skull or brain bleed.  I have refilled your Keppra medication for 28 days.  Please take it twice daily.  Please call [facility] to schedule an appointment.  Their neurology practice located here in [place] and will be able to refill your Keppra for the future.  Please return back to the emergency department as needed.

## 2021-05-02 NOTE — Discharge Planning (Signed)
RNCM consulted regarding medication assistance of pt unable to afford medications.  RNCM utilized [facility] petty cash to cover $11.00 co-pay and filled through [facility].  Rx e[facility]scribed to [facility] and delivered to pt prior to discharge home.  No further RNCM needs identified at this time.
